# Patient Record
Sex: Male | Born: 1990 | Race: White | Hispanic: No | Marital: Married | Smoking: Former smoker
Health system: Southern US, Community
[De-identification: ages and names within clinical notes are randomized; demographics above are authoritative.]

## PROBLEM LIST (undated history)

## (undated) DIAGNOSIS — Z91018 Allergy to other foods: Secondary | ICD-10-CM

## (undated) DIAGNOSIS — H409 Unspecified glaucoma: Secondary | ICD-10-CM

## (undated) DIAGNOSIS — T7840XA Allergy, unspecified, initial encounter: Secondary | ICD-10-CM

## (undated) DIAGNOSIS — F419 Anxiety disorder, unspecified: Secondary | ICD-10-CM

## (undated) HISTORY — PX: FRACTURE SURGERY: SHX138

## (undated) HISTORY — DX: Anxiety disorder, unspecified: F41.9

## (undated) HISTORY — DX: Unspecified glaucoma: H40.9

## (undated) HISTORY — DX: Allergy, unspecified, initial encounter: T78.40XA

## (undated) HISTORY — PX: HAND SURGERY: SHX662

---
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
## 2009-04-07 ENCOUNTER — Emergency Department: Payer: Self-pay | Admitting: Emergency Medicine

## 2010-03-28 ENCOUNTER — Encounter (INDEPENDENT_AMBULATORY_CARE_PROVIDER_SITE_OTHER): Payer: Self-pay | Admitting: Internal Medicine

## 2010-03-28 ENCOUNTER — Ambulatory Visit: Payer: Self-pay | Admitting: *Deleted

## 2010-05-16 ENCOUNTER — Observation Stay: Payer: Self-pay | Admitting: Surgery

## 2010-06-27 NOTE — Letter (Signed)
Summary: DOT-Dept of Transportation  DOT-Dept of Transportation   Imported By: Dorna Leitz 03/28/2010 14:36:39  _____________________________________________________________________  External Attachment:    Type:   Image     Comment:   External Document
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
## 2011-04-24 ENCOUNTER — Emergency Department: Payer: Self-pay | Admitting: Unknown Physician Specialty

## 2014-06-10 ENCOUNTER — Emergency Department: Payer: Self-pay | Admitting: Emergency Medicine

## 2014-06-10 LAB — COMPREHENSIVE METABOLIC PANEL
ALBUMIN: 4 g/dL (ref 3.4–5.0)
ALK PHOS: 91 U/L
ANION GAP: 11 (ref 7–16)
BUN: 15 mg/dL (ref 7–18)
Bilirubin,Total: 0.5 mg/dL (ref 0.2–1.0)
CALCIUM: 8.3 mg/dL — AB (ref 8.5–10.1)
Chloride: 102 mmol/L (ref 98–107)
Co2: 27 mmol/L (ref 21–32)
Creatinine: 1.28 mg/dL (ref 0.60–1.30)
EGFR (African American): >60
EGFR (Non-African Amer.): >60
Glucose: 153 mg/dL — ABNORMAL HIGH (ref 65–99)
Osmolality: 283 (ref 275–301)
Potassium: 2.7 mmol/L — ABNORMAL LOW (ref 3.5–5.1)
SGOT(AST): 28 U/L (ref 15–37)
SGPT (ALT): 54 U/L
Sodium: 140 mmol/L (ref 136–145)
Total Protein: 7 g/dL (ref 6.4–8.2)

## 2014-06-10 LAB — CBC WITH DIFFERENTIAL/PLATELET
BASOS PCT: 0.4 %
Basophil #: 0.1 10*3/uL (ref 0.0–0.1)
EOS PCT: 1.4 %
Eosinophil #: 0.3 10*3/uL (ref 0.0–0.7)
HCT: 51.1 % (ref 40.0–52.0)
HGB: 16.9 g/dL (ref 13.0–18.0)
LYMPHS PCT: 31.3 %
Lymphocyte #: 6 10*3/uL — ABNORMAL HIGH (ref 1.0–3.6)
MCH: 29.1 pg (ref 26.0–34.0)
MCHC: 33.1 g/dL (ref 32.0–36.0)
MCV: 88 fL (ref 80–100)
MONOS PCT: 9.7 %
Monocyte #: 1.9 x10 3/mm — ABNORMAL HIGH (ref 0.2–1.0)
NEUTROS PCT: 57.2 %
Neutrophil #: 11 10*3/uL — ABNORMAL HIGH (ref 1.4–6.5)
Platelet: 334 10*3/uL (ref 150–440)
RBC: 5.83 10*6/uL (ref 4.40–5.90)
RDW: 12.8 % (ref 11.5–14.5)
WBC: 19.2 10*3/uL — ABNORMAL HIGH (ref 3.8–10.6)

## 2014-06-10 LAB — TROPONIN I: Troponin-I: <0.02 ng/mL

## 2014-06-10 LAB — CK TOTAL AND CKMB (NOT AT ARMC)
CK, Total: 363 U/L — ABNORMAL HIGH (ref 39–308)
CK-MB: 2.5 ng/mL (ref 0.5–3.6)

## 2014-09-01 ENCOUNTER — Ambulatory Visit
Admit: 2014-09-01 | Disposition: A | Discharge status: Home / Self Care | Payer: Self-pay | Attending: Unknown Physician Specialty | Admitting: Unknown Physician Specialty

## 2014-10-26 ENCOUNTER — Other Ambulatory Visit: Payer: Self-pay | Admitting: Family Medicine

## 2014-10-26 ENCOUNTER — Ambulatory Visit
Admission: RE | Admit: 2014-10-26 | Discharge: 2014-10-26 | Disposition: A | Discharge status: Home / Self Care | Payer: BLUE CROSS/BLUE SHIELD | Source: Ambulatory Visit | Attending: Family Medicine | Admitting: Family Medicine

## 2014-10-26 DIAGNOSIS — N433 Hydrocele, unspecified: Secondary | ICD-10-CM | POA: Insufficient documentation

## 2014-10-26 DIAGNOSIS — N448 Other noninflammatory disorders of the testis: Secondary | ICD-10-CM | POA: Insufficient documentation

## 2014-10-26 DIAGNOSIS — N5089 Other specified disorders of the male genital organs: Secondary | ICD-10-CM

## 2014-10-26 DIAGNOSIS — Z880 Allergy status to penicillin: Secondary | ICD-10-CM | POA: Insufficient documentation

## 2016-01-25 ENCOUNTER — Emergency Department
Admission: EM | Admit: 2016-01-25 | Discharge: 2016-01-25 | Disposition: A | Discharge status: Home / Self Care | Payer: Managed Care, Other (non HMO) | Attending: Emergency Medicine | Admitting: Emergency Medicine

## 2016-01-25 DIAGNOSIS — T7840XA Allergy, unspecified, initial encounter: Secondary | ICD-10-CM | POA: Diagnosis not present

## 2016-01-25 DIAGNOSIS — L509 Urticaria, unspecified: Secondary | ICD-10-CM

## 2016-01-25 DIAGNOSIS — X58XXXA Exposure to other specified factors, initial encounter: Secondary | ICD-10-CM | POA: Diagnosis not present

## 2016-01-25 MED ORDER — DIPHENHYDRAMINE HCL 50 MG/ML IJ SOLN
50.0000 mg | Freq: Once | INTRAMUSCULAR | Status: AC
  Administered 2016-01-25: 50 mg via INTRAVENOUS

## 2016-01-25 MED ORDER — EPINEPHRINE 0.3 MG/0.3ML IJ SOAJ: 0.3000 mg | Freq: Once | INTRAMUSCULAR | 0 refills | Status: AC

## 2016-01-25 MED ORDER — SODIUM CHLORIDE 0.9 % IV BOLUS (SEPSIS)
1000.0000 mL | Freq: Once | INTRAVENOUS | Status: AC
  Administered 2016-01-25: 1000 mL via INTRAVENOUS

## 2016-01-25 MED ORDER — FAMOTIDINE IN NACL 20-0.9 MG/50ML-% IV SOLN
20.0000 mg | Freq: Once | INTRAVENOUS | Status: AC
  Administered 2016-01-25: 20 mg via INTRAVENOUS

## 2016-01-25 MED ORDER — METHYLPREDNISOLONE SODIUM SUCC 125 MG IJ SOLR
125.0000 mg | Freq: Once | INTRAMUSCULAR | Status: AC
  Administered 2016-01-25: 125 mg via INTRAVENOUS

## 2016-01-25 MED ORDER — PREDNISONE 20 MG PO TABS: 60.0000 mg | ORAL_TABLET | Freq: Every day | ORAL | 0 refills | Status: AC

## 2016-01-25 NOTE — ED Notes (Signed)
Pt sleeping on stretcher. Pt wife at bedside. Pt woke up and stating he feels better, states nauseous but does not want any zofran at this time. Skin no longer red, no hives present. Pt talking in complete sentences, no difficulty noticed.

## 2016-01-25 NOTE — ED Notes (Addendum)
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 Pt states allergy to penicillin.States 1 year ago had an anaphylactic reaction.   Pt states last eating at 8pm. Symptoms started 30 minutes PTA.

## 2016-01-25 NOTE — ED Provider Notes (Signed)
[facility] Emergency Department Provider Note _________   First MD Initiated Contact with Patient 01/25/16 0109     (approximate)  I have reviewed the triage vital signs and the nursing notes.   HISTORY  Chief Complaint Allergic Reaction    HPI William Morrow is a 25 y.o. male presents with generalized hives with "a little difficulty breathing and swallowing acute onset approximately 30 minutes before presentation. Patient admits to multiple known allergens however unaware of coming in contact with any today. Patient states last time he ate was at 8:00 PM   Past medical history Anaphylaxis There are no active problems to display for this patient.   Past Surgical History:  Procedure Laterality Date  . HAND SURGERY      Prior to Admission medications   Not on File    Allergies Penicillins  History reviewed. No pertinent family history.  Social History Social History  Substance Use Topics  . Smoking status: Never Smoker  . Smokeless tobacco: Not on file  . Alcohol use Yes    Review of Systems Constitutional: No fever/chills Eyes: No visual changes. ENT: No sore throat. Cardiovascular: Denies chest pain. Respiratory: Denies shortness of breath. Gastrointestinal: No abdominal pain.  No nausea, no vomiting.  No diarrhea.  No constipation. Genitourinary: Negative for dysuria. Musculoskeletal: Negative for back pain. Skin: Positive for hives and pruritus Neurological: Negative for headaches, focal weakness or numbness.  10-point ROS otherwise negative.  ____________________________________________   PHYSICAL EXAM:  VITAL SIGNS: ED Triage Vitals  Enc Vitals Group     BP 01/25/16 0111 124/87     Pulse Rate 01/25/16 0111 (!) 101     Resp 01/25/16 0111 19     Temp 01/25/16 0111 97.8 F (36.6 C)     Temp Source 01/25/16 0111 Oral     SpO2 01/25/16 0111 92 %     Weight --      Height --      Head Circumference --      Peak  Flow --      Pain Score 01/25/16 0112 0     Pain Loc --      Pain Edu? --      Excl. in GC? --     Constitutional: Alert and oriented. Well appearing and in no acute distress. Eyes: Conjunctivae are normal. PERRL. EOMI. Head: Atraumatic. Nose: No congestion/rhinnorhea. Mouth/Throat: Mucous membranes are moist.  Oropharynx non-erythematous. Neck: No stridor.  No meningeal signs. Cardiovascular: Normal rate, regular rhythm. Good peripheral circulation. Grossly normal heart sounds. Respiratory: Normal respiratory effort.  No retractions. Lungs CTAB. Gastrointestinal: Soft and nontender. No distention.  Musculoskeletal: No lower extremity tenderness nor edema. No gross deformities of extremities. Neurologic:  Normal speech and language. No gross focal neurologic deficits are appreciated.  Skin: Positive for generalized hives Psychiatric: Mood and affect are normal. Speech and behavior are normal.       Procedures    INITIAL IMPRESSION / ASSESSMENT AND PLAN / ED COURSE  Pertinent labs & imaging results that were available during my care of the patient were reviewed by me and considered in my medical decision making (see chart for details).  Patient given 50 mg IV Benadryl 20 mg Pepcid and 125 mg of Solu-Medrol with resolution of hives. Patient reevaluated 30 minutes after presentation to emergency department no difficulty breathing or swallowing.Patient prescribed prednisone daily 5 days as well as EpiPen's.   Clinical Course    ____________________________________________  FINAL CLINICAL IMPRESSION(S) /  ED DIAGNOSES  Final diagnoses:  Allergic reaction, initial encounter  Hives     MEDICATIONS GIVEN DURING THIS VISIT:  Medications  methylPREDNISolone sodium succinate (SOLU-MEDROL) 125 mg/2 mL injection 125 mg (125 mg Intravenous Given 01/25/16 0107)  diphenhydrAMINE (BENADRYL) injection 50 mg (50 mg Intravenous Given 01/25/16 0106)  famotidine (PEPCID) IVPB 20 mg  premix (0 mg Intravenous Stopped 01/25/16 0220)  sodium chloride 0.9 % bolus 1,000 mL (0 mLs Intravenous Stopped 01/25/16 0416)     NEW OUTPATIENT MEDICATIONS STARTED DURING THIS VISIT:  New Prescriptions   No medications on file      Note:  This document was prepared using Dragon voice recognition software and may include unintentional dictation errors.    Darci Current, MD 01/25/16 [id number]

## 2016-01-25 NOTE — ED Triage Notes (Signed)
Pt presents to ED via [facility]. Pt has red hives all over body, states a little difficulty breathing and swallowing. Pt denies pain. Pt states he ate aroung 8pm, denies being around any food he is allergic to. States allergic to malt wheatStates he is also allergic to penicillin. Pt c/o of nausea.

## 2016-03-02 DIAGNOSIS — N509 Disorder of male genital organs, unspecified: Secondary | ICD-10-CM | POA: Insufficient documentation

## 2016-03-06 DIAGNOSIS — G44219 Episodic tension-type headache, not intractable: Secondary | ICD-10-CM | POA: Insufficient documentation

## 2016-03-06 DIAGNOSIS — Z889 Allergy status to unspecified drugs, medicaments and biological substances status: Secondary | ICD-10-CM | POA: Insufficient documentation

## 2016-04-16 ENCOUNTER — Other Ambulatory Visit: Payer: Self-pay | Admitting: Neurology

## 2016-04-16 DIAGNOSIS — G43119 Migraine with aura, intractable, without status migrainosus: Secondary | ICD-10-CM

## 2016-04-27 ENCOUNTER — Ambulatory Visit: Payer: Managed Care, Other (non HMO)

## 2016-07-26 IMAGING — US US ART/VEN ABD/PELV/SCROTUM DOPPLER LTD
1 series · 14 of 25 positions shown · non-contrast
Comparison: None.

CLINICAL DATA: Palpable lump

EXAM:
SCROTAL ULTRASOUND
DOPPLER ULTRASOUND OF THE TESTICLES
TECHNIQUE: Complete ultrasound examination of the testicles, epididymis, and
other scrotal structures was performed. Color and spectral Doppler
ultrasound were also utilized to evaluate blood flow to the
testicles.

[Series 1: us art/ven abd/pelv/scrotum doppler ltd · 0.07mm/px · 14 of 75 slices shown]
[im 1/75]
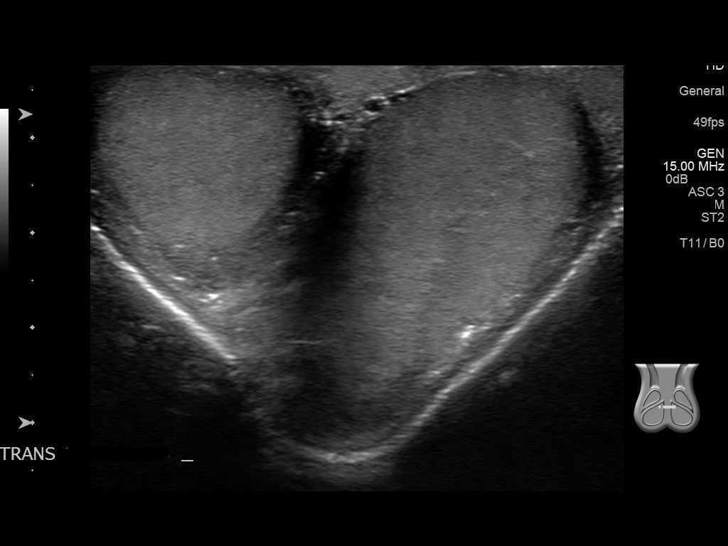
[im 7/75]
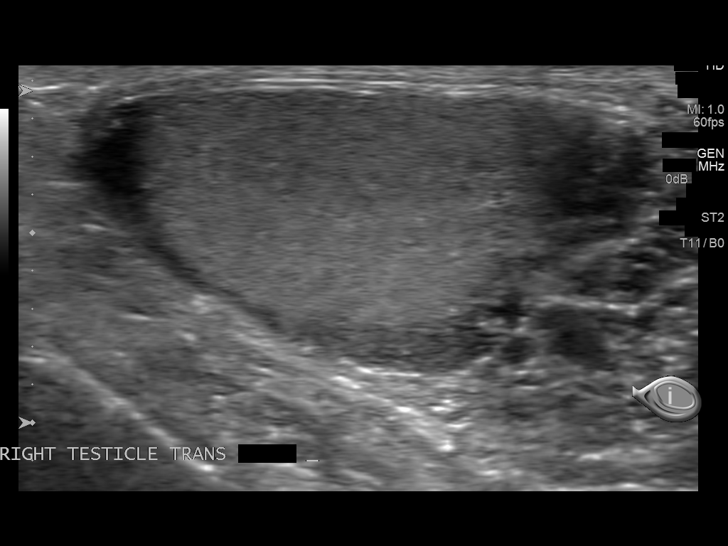
[im 13/75]
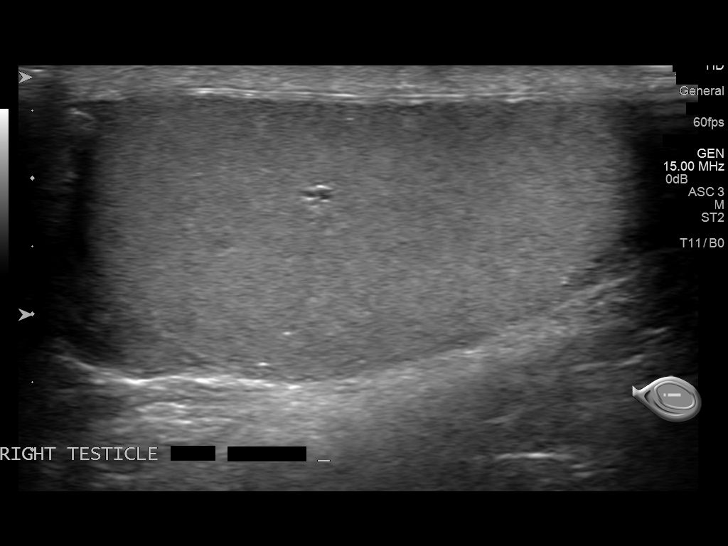
[im 19/75]
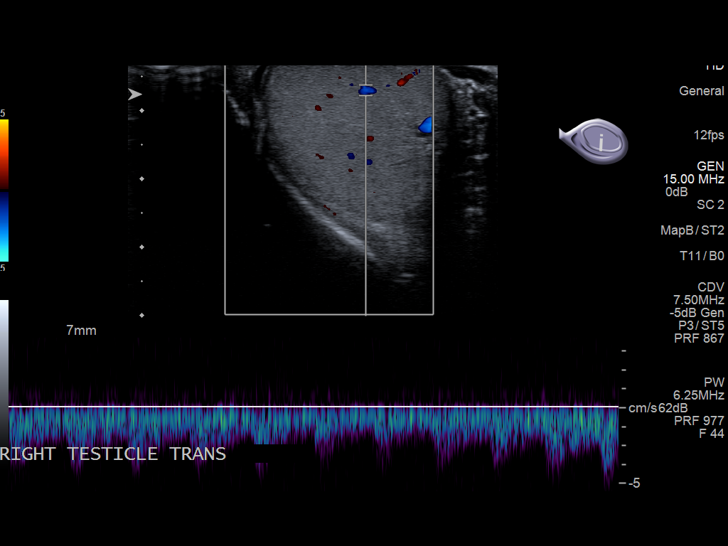
[im 25/75]
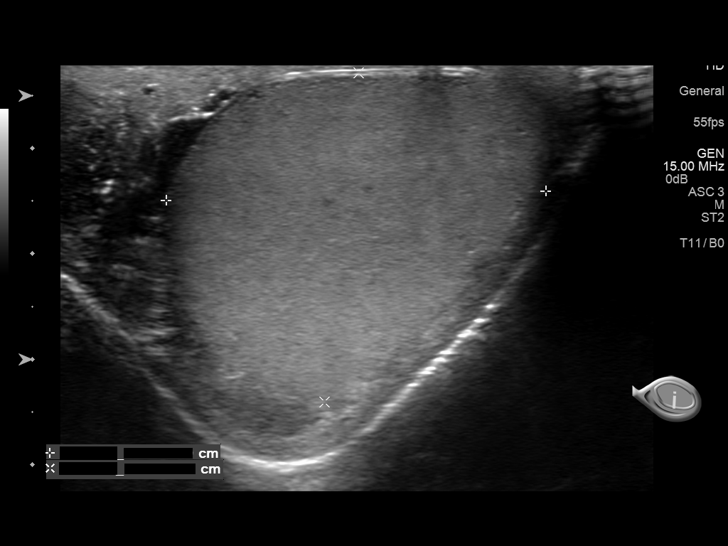
[im 28/75]
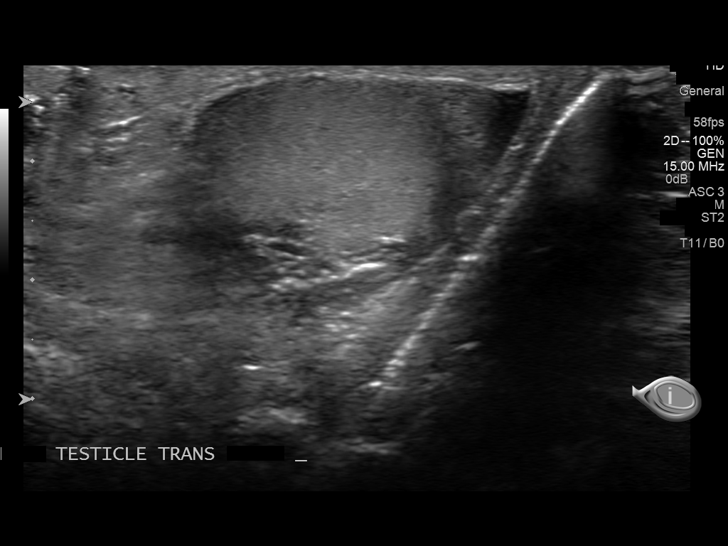
[im 34/75]
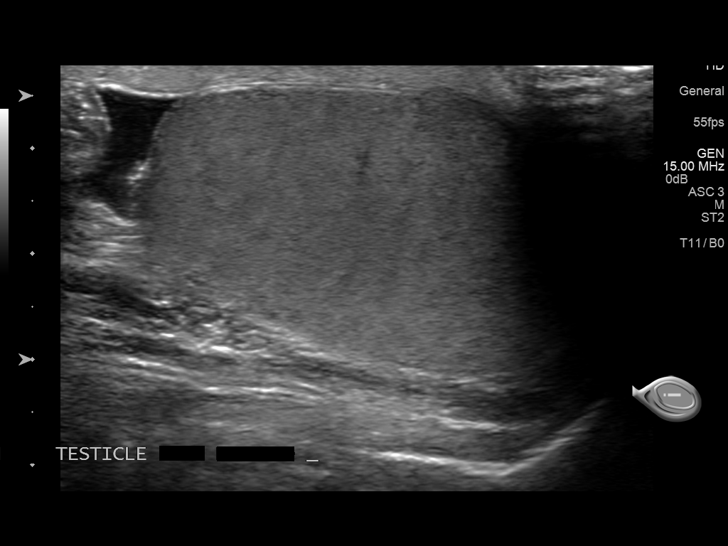
[im 41/75]
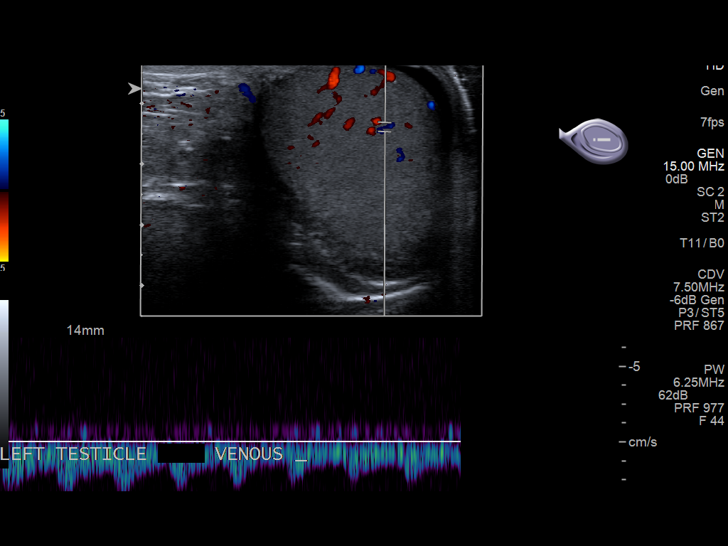
[im 47/75]
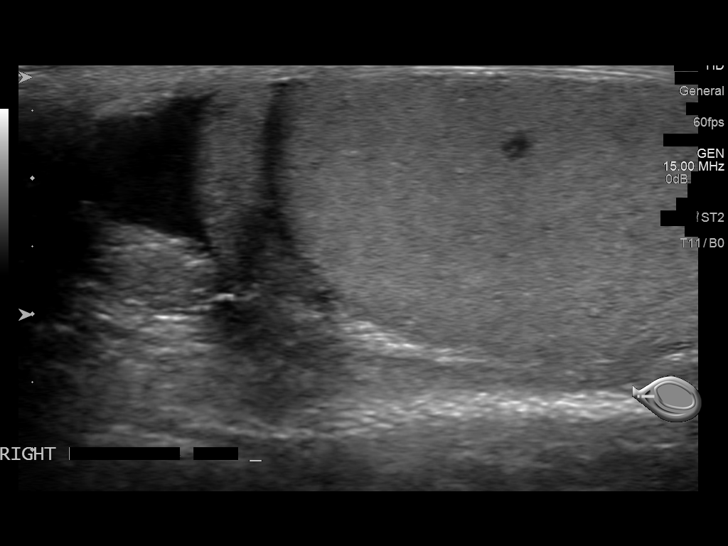
[im 50/75]
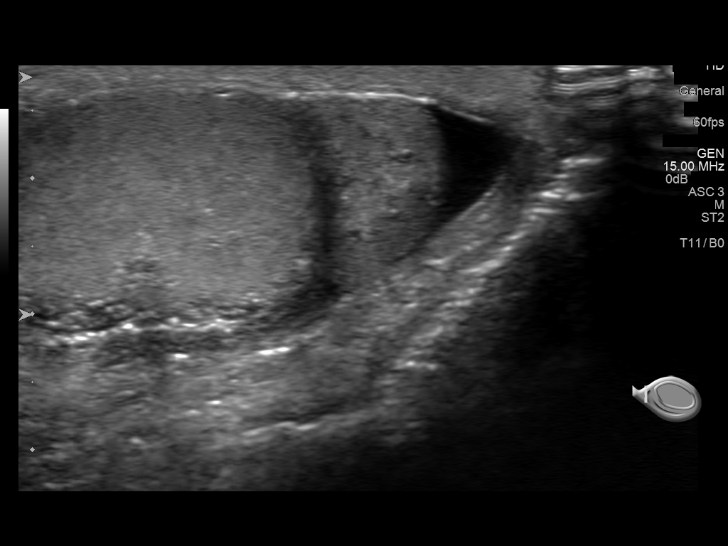
[im 56/75]
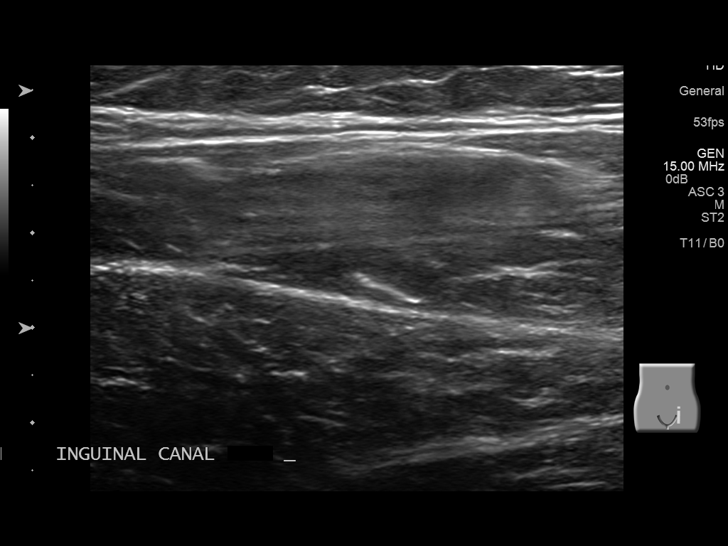
[im 62/75]
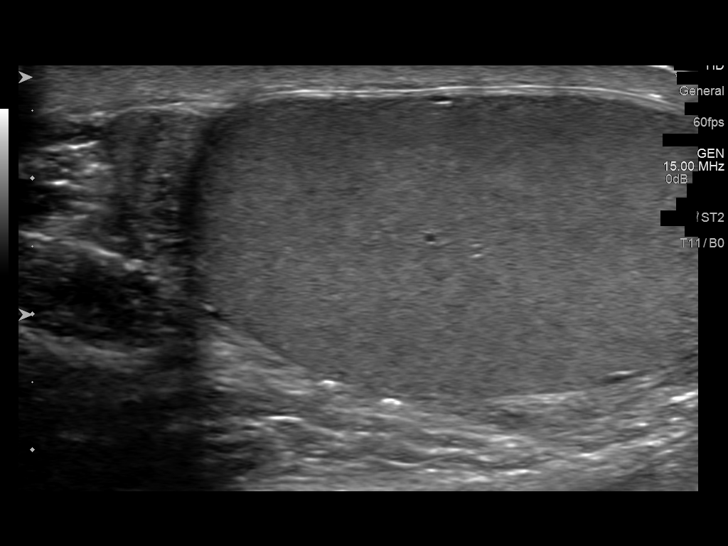
[im 68/75]
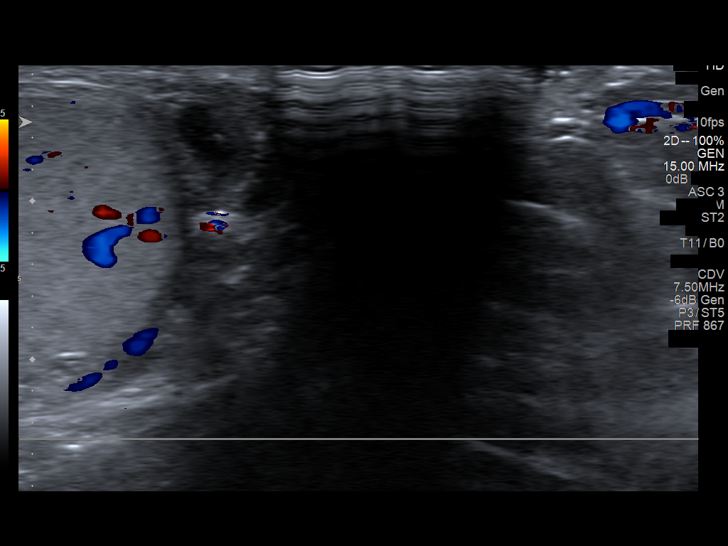
[im 75/75]
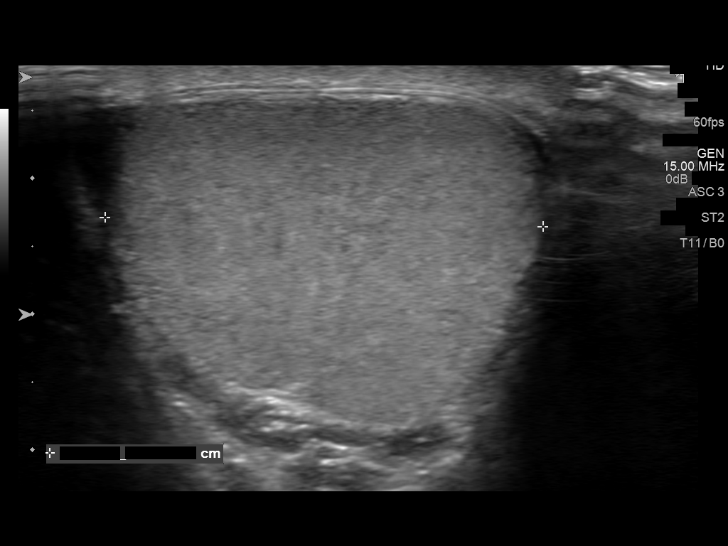

[14 of 25 positions shown; findings below may reference images not displayed]

FINDINGS: Right testicle

Measurements: 4.2 x 2.3 x 3.2 cm.. No mass or microlithiasis
visualized.

Left testicle

Measurements: 4.4 x 2.1 x 3.4 cm.. No mass or microlithiasis
visualized. Small scrotal pearl is noted adjacent to the left
testicle. This measures 3 mm in greatest dimension.

Right epididymis:  Normal in size and appearance.

Left epididymis:  Normal in size and appearance.

Hydrocele:  Small bilateral hydroceles are seen.

Varicocele:  None visualized.

Pulsed Doppler interrogation of both testes demonstrates normal low
resistance arterial and venous waveforms bilaterally.
IMPRESSION: Small left scrotal pleural which corresponds to the palpable
abnormality.

Small bilateral hydroceles.

## 2017-06-27 DIAGNOSIS — Z91018 Allergy to other foods: Secondary | ICD-10-CM | POA: Insufficient documentation

## 2017-06-27 DIAGNOSIS — E559 Vitamin D deficiency, unspecified: Secondary | ICD-10-CM | POA: Insufficient documentation

## 2019-01-20 ENCOUNTER — Other Ambulatory Visit: Payer: Self-pay

## 2019-01-20 DIAGNOSIS — Z20822 Contact with and (suspected) exposure to covid-19: Secondary | ICD-10-CM

## 2019-01-21 LAB — NOVEL CORONAVIRUS, NAA: SARS-CoV-2, NAA: NOT DETECTED

## 2019-01-29 ENCOUNTER — Other Ambulatory Visit: Payer: Self-pay

## 2019-01-29 DIAGNOSIS — Z20822 Contact with and (suspected) exposure to covid-19: Secondary | ICD-10-CM

## 2019-01-30 ENCOUNTER — Telehealth: Payer: Self-pay | Admitting: General Practice

## 2019-01-30 LAB — NOVEL CORONAVIRUS, NAA: SARS-CoV-2, NAA: NOT DETECTED

## 2019-01-30 NOTE — Telephone Encounter (Signed)
Pt called in for covid result.  °Advised of Not Detected result.  °

## 2021-02-04 ENCOUNTER — Ambulatory Visit
Admission: EM | Admit: 2021-02-04 | Discharge: 2021-02-04 | Disposition: A | Discharge status: Home / Self Care | Payer: 59 | Attending: Emergency Medicine | Admitting: Emergency Medicine

## 2021-02-04 DIAGNOSIS — Z20822 Contact with and (suspected) exposure to covid-19: Secondary | ICD-10-CM | POA: Diagnosis not present

## 2021-02-04 DIAGNOSIS — J069 Acute upper respiratory infection, unspecified: Secondary | ICD-10-CM | POA: Diagnosis not present

## 2021-02-04 HISTORY — DX: Allergy to other foods: Z91.018

## 2021-02-04 MED ORDER — BENZONATATE 100 MG PO CAPS: 200.0000 mg | ORAL_CAPSULE | Freq: Three times a day (TID) | ORAL | 0 refills | Status: DC

## 2021-02-04 MED ORDER — IPRATROPIUM BROMIDE 0.06 % NA SOLN: 2.0000 | Freq: Four times a day (QID) | NASAL | 12 refills | Status: DC

## 2021-02-04 MED ORDER — PROMETHAZINE-DM 6.25-15 MG/5ML PO SYRP: 5.0000 mL | ORAL_SOLUTION | Freq: Four times a day (QID) | ORAL | 0 refills | Status: DC | PRN

## 2021-02-04 NOTE — Discharge Instructions (Addendum)
Isolate at home pending the results of your COVID test.  If you test positive then you will have to quarantine for 5 days from the start of your symptoms.  After 5 days you can break quarantine if your symptoms have improved and you have not had a fever for 24 hours without taking Tylenol or ibuprofen.  Use over-the-counter Tylenol and ibuprofen as needed for body aches and fever.  Use the Tessalon Perles during the day as needed for cough and the Promethazine DM cough syrup at nighttime as will make you drowsy.  Use the Atrovent nasal spray every 6 hours as needed, 2 squirts in each nostril, for nasal congestion and postnasal drip.  If you develop any increased shortness of breath-especially at rest, you are unable to speak in full sentences, or is a late sign your lips are turning blue you need to go the ER for evaluation.

## 2021-02-04 NOTE — ED Provider Notes (Signed)
[facility]    CSN: [id number] Arrival date & time: 02/04/21  1941      History   Chief Complaint Chief Complaint  Patient presents with   Cough   Nasal Congestion    HPI William Morrow is a 30 y.o. male.   HPI  30 year old male here for evaluation of respiratory complaints.  Patient reports that for the last 2 days he has been experiencing nasal congestion with clear nasal discharge, watery eyes, sore throat, headache, and productive cough for yellow sputum.  He denies fever, ear pain or pressure, shortness breath or wheezing, GI complaints, or known sick contacts.  Past Medical History:  Diagnosis Date   Allergy to alpha-gal     There are no problems to display for this patient.   Past Surgical History:  Procedure Laterality Date   HAND SURGERY         Home Medications    Prior to Admission medications   Medication Sig Start Date End Date Taking? Authorizing Provider  benzonatate (TESSALON) 100 MG capsule Take 2 capsules (200 mg total) by mouth every 8 (eight) hours. 02/04/21  Yes Becky Augusta, NP  ipratropium (ATROVENT) 0.06 % nasal spray Place 2 sprays into both nostrils 4 (four) times daily. 02/04/21  Yes Becky Augusta, NP  promethazine-dextromethorphan (PROMETHAZINE-DM) 6.25-15 MG/5ML syrup Take 5 mLs by mouth 4 (four) times daily as needed. 02/04/21  Yes Becky Augusta, NP    Family History History reviewed. No pertinent family history.  Social History Social History   Tobacco Use   Smoking status: Every Day    Packs/day: 0.50    Types: Cigarettes   Smokeless tobacco: Never  Vaping Use   Vaping Use: Never used  Substance Use Topics   Alcohol use: Yes    Comment: socially   Drug use: No     Allergies   Alpha-gal and Penicillins   Review of Systems Review of Systems  Constitutional:  Negative for activity change, appetite change and fever.  HENT:  Positive for congestion, postnasal drip, rhinorrhea and sore throat. Negative for  ear pain.   Respiratory:  Positive for cough. Negative for shortness of breath and stridor.   Gastrointestinal:  Negative for diarrhea, nausea and vomiting.  Neurological:  Positive for headaches.  Hematological: Negative.     Physical Exam Triage Vital Signs ED Triage Vitals  Enc Vitals Group     BP 02/04/21 1030 137/89     Pulse Rate 02/04/21 1030 84     Resp 02/04/21 1030 18     Temp 02/04/21 1030 98.6 F (37 C)     Temp Source 02/04/21 1030 Oral     SpO2 02/04/21 1030 96 %     Weight 02/04/21 1032 270 lb (122.5 kg)     Height 02/04/21 1032 6\' 1"  (1.854 m)     Head Circumference --      Peak Flow --      Pain Score 02/04/21 1032 0     Pain Loc --      Pain Edu? --      Excl. in GC? --    No data found.  Updated Vital Signs BP 137/89 (BP Location: Left Arm)   Pulse 84   Temp 98.6 F (37 C) (Oral)   Resp 18   Ht 6\' 1"  (1.854 m)   Wt 270 lb (122.5 kg)   SpO2 96%   BMI 35.62 kg/m   Visual Acuity Right Eye Distance:   Left  Eye Distance:   Bilateral Distance:    Right Eye Near:   Left Eye Near:    Bilateral Near:     Physical Exam Vitals and nursing note reviewed.  Constitutional:      General: He is not in acute distress.    Appearance: Normal appearance. He is not ill-appearing.  HENT:     Head: Normocephalic and atraumatic.     Right Ear: Tympanic membrane, ear canal and external ear normal. There is no impacted cerumen.     Left Ear: Tympanic membrane, ear canal and external ear normal. There is no impacted cerumen.     Nose: Congestion and rhinorrhea present.     Mouth/Throat:     Mouth: Mucous membranes are moist.     Pharynx: Oropharynx is clear. Posterior oropharyngeal erythema present.  Cardiovascular:     Rate and Rhythm: Normal rate and regular rhythm.     Pulses: Normal pulses.     Heart sounds: Normal heart sounds. No murmur heard.   No gallop.  Pulmonary:     Effort: Pulmonary effort is normal.     Breath sounds: Normal breath sounds.  No wheezing, rhonchi or rales.  Musculoskeletal:     Cervical back: Normal range of motion and neck supple.  Lymphadenopathy:     Cervical: No cervical adenopathy.  Skin:    General: Skin is warm and dry.     Capillary Refill: Capillary refill takes less than 2 seconds.     Findings: No erythema or rash.  Neurological:     General: No focal deficit present.     Mental Status: He is alert and oriented to person, place, and time.  Psychiatric:        Mood and Affect: Mood normal.        Behavior: Behavior normal.        Thought Content: Thought content normal.        Judgment: Judgment normal.     UC Treatments / Results  Labs (all labs ordered are listed, but only abnormal results are displayed) Labs Reviewed  SARS CORONAVIRUS 2 (TAT 6-24 HRS)    EKG   Radiology No results found.  Procedures Procedures (including critical care time)  Medications Ordered in UC Medications - No data to display  Initial Impression / Assessment and Plan / UC Course  I have reviewed the triage vital signs and the nursing notes.  Pertinent labs & imaging results that were available during my care of the patient were reviewed by me and considered in my medical decision making (see chart for details).  Patient is very pleasant, nontoxic-appearing 30 year old male here for evaluation of respiratory complaints as outlined in HPI above.  Patient's physical exam reveals pearly gray tympanic membranes bilaterally with a normal light reflex and clear external auditory canals.  Nasal mucosa is erythematous edematous with scant clear nasal discharge.  Oropharyngeal exam reveals posterior oropharyngeal erythema and cobblestoning with clear postnasal drip.  No cervical lymphadenopathy appreciated exam.  Cardiopulmonary exam reveals clear lung sounds in all fields.  Patient exam is consistent with a viral URI with cough.  Will swab patient for COVID.  Due to the lack of fever I do not suspect patient has  influenza.  Patient will be discharged home to isolate pending the results of the COVID test and the left quarantine for 5 days from onset of symptoms if he is COVID-positive.  We will give Atrovent nasal spray to help with nasal congestion and runny nose.  We will also prescribe Tessalon Perles and Promethazine DM cough syrup to help with cough and congestion.  ER and return precautions reviewed with patient.  Patient denies need for work note.   Final Clinical Impressions(s) / UC Diagnoses   Final diagnoses:  Viral URI with cough     Discharge Instructions      Isolate at home pending the results of your COVID test.  If you test positive then you will have to quarantine for 5 days from the start of your symptoms.  After 5 days you can break quarantine if your symptoms have improved and you have not had a fever for 24 hours without taking Tylenol or ibuprofen.  Use over-the-counter Tylenol and ibuprofen as needed for body aches and fever.  Use the Tessalon Perles during the day as needed for cough and the Promethazine DM cough syrup at nighttime as will make you drowsy.  Use the Atrovent nasal spray every 6 hours as needed, 2 squirts in each nostril, for nasal congestion and postnasal drip.  If you develop any increased shortness of breath-especially at rest, you are unable to speak in full sentences, or is a late sign your lips are turning blue you need to go the ER for evaluation.      ED Prescriptions     Medication Sig Dispense Auth. Provider   benzonatate (TESSALON) 100 MG capsule Take 2 capsules (200 mg total) by mouth every 8 (eight) hours. 21 capsule Becky Augusta, NP   ipratropium (ATROVENT) 0.06 % nasal spray Place 2 sprays into both nostrils 4 (four) times daily. 15 mL Becky Augusta, NP   promethazine-dextromethorphan (PROMETHAZINE-DM) 6.25-15 MG/5ML syrup Take 5 mLs by mouth 4 (four) times daily as needed. 118 mL Becky Augusta, NP      PDMP not reviewed this encounter.    Becky Augusta, NP 02/04/21 1143

## 2021-02-04 NOTE — ED Triage Notes (Signed)
Pt reports watery eyes, cough with "yellow mucus", nasal congestion for 2 days. Denies fever.  Negative home COVID test (yesterday)

## 2021-02-05 LAB — SARS CORONAVIRUS 2 (TAT 6-24 HRS): SARS Coronavirus 2: NEGATIVE

## 2022-09-06 ENCOUNTER — Ambulatory Visit: Payer: 59 | Admitting: Internal Medicine

## 2022-09-17 ENCOUNTER — Ambulatory Visit (INDEPENDENT_AMBULATORY_CARE_PROVIDER_SITE_OTHER): Payer: 59 | Admitting: Internal Medicine

## 2022-09-17 ENCOUNTER — Encounter: Payer: Self-pay | Admitting: Internal Medicine

## 2022-09-17 DIAGNOSIS — G4700 Insomnia, unspecified: Secondary | ICD-10-CM | POA: Insufficient documentation

## 2022-09-17 DIAGNOSIS — I10 Essential (primary) hypertension: Secondary | ICD-10-CM | POA: Diagnosis not present

## 2022-09-17 DIAGNOSIS — R198 Other specified symptoms and signs involving the digestive system and abdomen: Secondary | ICD-10-CM | POA: Insufficient documentation

## 2022-09-17 DIAGNOSIS — J302 Other seasonal allergic rhinitis: Secondary | ICD-10-CM | POA: Diagnosis not present

## 2022-09-17 DIAGNOSIS — G43711 Chronic migraine without aura, intractable, with status migrainosus: Secondary | ICD-10-CM

## 2022-09-17 DIAGNOSIS — Z119 Encounter for screening for infectious and parasitic diseases, unspecified: Secondary | ICD-10-CM | POA: Diagnosis not present

## 2022-09-17 DIAGNOSIS — Z91018 Allergy to other foods: Secondary | ICD-10-CM | POA: Diagnosis not present

## 2022-09-17 DIAGNOSIS — Z889 Allergy status to unspecified drugs, medicaments and biological substances status: Secondary | ICD-10-CM

## 2022-09-17 DIAGNOSIS — G4709 Other insomnia: Secondary | ICD-10-CM

## 2022-09-17 DIAGNOSIS — G43909 Migraine, unspecified, not intractable, without status migrainosus: Secondary | ICD-10-CM | POA: Insufficient documentation

## 2022-09-17 MED ORDER — SUMATRIPTAN SUCCINATE 50 MG PO TABS: 50.0000 mg | ORAL_TABLET | ORAL | 11 refills | Status: DC | PRN

## 2022-09-17 MED ORDER — METFORMIN HCL 500 MG PO TABS: 500.0000 mg | ORAL_TABLET | Freq: Two times a day (BID) | ORAL | 3 refills | Status: DC

## 2022-09-17 MED ORDER — TOPIRAMATE 25 MG PO TABS: 25.0000 mg | ORAL_TABLET | Freq: Two times a day (BID) | ORAL | 3 refills | Status: DC

## 2022-09-17 MED ORDER — LOSARTAN POTASSIUM 25 MG PO TABS: 25.0000 mg | ORAL_TABLET | Freq: Every day | ORAL | 3 refills | Status: DC

## 2022-09-17 MED ORDER — ALBUTEROL SULFATE HFA 108 (90 BASE) MCG/ACT IN AERS: 1.0000 | INHALATION_SPRAY | RESPIRATORY_TRACT | 11 refills | Status: DC | PRN

## 2022-09-17 MED ORDER — EPINEPHRINE 0.3 MG/0.3ML IJ SOAJ: 0.3000 mg | INTRAMUSCULAR | 1 refills | Status: DC | PRN

## 2022-09-17 MED ORDER — FLUTICASONE PROPIONATE 50 MCG/ACT NA SUSP: 1.0000 | Freq: Every day | NASAL | 0 refills | Status: DC

## 2022-09-17 NOTE — Assessment & Plan Note (Signed)
Start losartan Sleep apnea screening positive Noted that sleep apnea causes hypertension and migraines Encouraged weight loss

## 2022-09-17 NOTE — Progress Notes (Signed)
[facility]  Phone: [phone]  New patient visit  Visit Date: 09/17/2022 Patient: William Morrow   DOB: 11/01/1990   31 y.o. Male  MRN: [id number] PCP:  William Olszewski, MD  (establishing care today)  Today's Health Care Provider: Lula Olszewski, MD   Assessment and Plan:   This initial visit focused on establishing a primary care relationship and gathering a comprehensive medical history. We addressed key concerns and prioritized next steps. For any remaining concerns, we encourage William Morrow to schedule a follow-up appointment at his earliest convenience.   To further complete the medical history and optimize health maintenance, we also recommend scheduling an Annual Comprehensive Physical Exam (CPE) also known as Preventive Medicine Visit.  Morbid obesity Assessment & Plan: We will incorporate routine monitoring of body metrics as a proactive approach to health maintenance at future visits. Encouraged the continuation of current health-positive behaviors and am available for additional supportive measures as desired. I support waist circumference monitoring rather than focusing on weight, to avoid unhealthy weight loss (muscle loss). I chose topiramate to help with this and his migraines  Orders: -     CBC; Future -     Comprehensive metabolic panel; Future -     Lipid panel; Future -     Hemoglobin A1c; Future -     TSH; Future -     metFORMIN HCl; Take 1 tablet (500 mg total) by mouth 2 (two) times daily with a meal.  Dispense: 180 tablet; Refill: 3  Screening examination for infectious disease -     HIV Antibody (routine testing w rflx); Future -     Hepatitis C antibody; Future  Hypertension, unspecified type Assessment & Plan: Start losartan Sleep apnea screening positive Noted that sleep apnea causes hypertension and migraines Encouraged weight loss   Orders: -     Losartan Potassium; Take 1 tablet (25 mg total) by mouth daily.  Dispense:  90 tablet; Refill: 3  Seasonal allergies -     Albuterol Sulfate HFA; Inhale 1 puff into the lungs every 4 (four) hours as needed for wheezing or shortness of breath.  Dispense: 2 each; Refill: 11 -     EPINEPHrine; Inject 0.3 mg into the muscle as needed for anaphylaxis.  Dispense: 1 each; Refill: 1 -     Fluticasone Propionate; Place 1 spray into both nostrils daily.  Dispense: 10 mL; Refill: 0  History of allergic reaction -     Albuterol Sulfate HFA; Inhale 1 puff into the lungs every 4 (four) hours as needed for wheezing or shortness of breath.  Dispense: 2 each; Refill: 11 -     EPINEPHrine; Inject 0.3 mg into the muscle as needed for anaphylaxis.  Dispense: 1 each; Refill: 1 -     Fluticasone Propionate; Place 1 spray into both nostrils daily.  Dispense: 10 mL; Refill: 0  Allergy to alpha-gal -     Albuterol Sulfate HFA; Inhale 1 puff into the lungs every 4 (four) hours as needed for wheezing or shortness of breath.  Dispense: 2 each; Refill: 11 -     EPINEPHrine; Inject 0.3 mg into the muscle as needed for anaphylaxis.  Dispense: 1 each; Refill: 1 -     Fluticasone Propionate; Place 1 spray into both nostrils daily.  Dispense: 10 mL; Refill: 0  Abnormal bowel movement Assessment & Plan: This was major complaint Sounds like IBS-D but also may be due to alpha gal allergy Probably needs colonoscopy to  rule out inflammatory bowel disease   Orders: -     Ambulatory referral to Gastroenterology  Intractable chronic migraine without aura and with status migrainosus Assessment & Plan: I explained how migraines that are constant like his are treated with 2 medication types preventive and abortive.  I explained the different types of preventative and abortive medications as follows preventative medications include propranolol topiramate and amitriptyline and these are taken daily if different side effects depending on the medicine but I think that topiramate would be the best choice for him  because it has a weight loss benefit but at the higher dose the risks are it is going to make him a little lower processing speed at the high doses but we are just going to start with a low-dose.    The abortive medication issues include Imitrex mainly to start just based on the way insurance companies force it as the first medicine is not necessarily the best but we will have to try it first a lot of people find it to work just great and but basically it is chosen because it is the cheapest the most common side effect is spasms like he can affect chest spasms or even heart spasms it feels weird     Orders: -     Topiramate; Take 1 tablet (25 mg total) by mouth 2 (two) times daily. To start: just 1 tablet daily for a week. Then 1 tablet twice daily long term and important to take consistent - helps with weight loss and preventing migraines  Dispense: 180 tablet; Refill: 3 -     SUMAtriptan Succinate; Take 1 tablet (50 mg total) by mouth every 2 (two) hours as needed for migraine. May repeat in 2 hours if headache persists or recurs.  Dispense: 10 tablet; Refill: 11  Other insomnia -     Ambulatory referral to Sleep Studies      Subjective:   William Morrow presents today with intent to establish care with William Olszewski, MD as his Primary Care Provider (PCP) going forward.   His main concern(s) / chief complaint(s) are New Patient (Initial Visit), Frequent/inconsistent bowel movement (Having four to five bowel movements per day, some are runny and some are solid.), Medication Refill (Epipen and prednisone.), Urticaria (Most recent reaction was Thursday. Not sure about the cause./), and Hypertension (High readings in past)   HPI  Problem-oriented charting was used to develop and update his medical history: Problem  Hypertension   Home readings: high often Patient reports taking current medications consistently and not experiencing any significant associated side effects or symptoms  As of  09/17/2022 Not on meds Lab Results  Component Value Date   NA 140 06/10/2014   K 2.7 (L) 06/10/2014    Morbid Obesity  Abnormal Bowel Movement   He has many years of abnormal bowels going 2-3 times a day but it has been worse in the last year and a half more like 6 times a day or more never had a colonoscopy does have an alpha gal allergy and eats red meat   Seasonal Allergies  Migraine   Affected by dehydration with landscaping job Keeping it cool seems to help    Insomnia   Wakes up several times at night and sometimes difficulty getting to sleep STOP-Bang Score Do you snore loudly?: Yes Do you often feel tired, fatigued, or sleepy during the daytime?: Yes Has anyone observed you stop breathing during sleep?: No Do you have (or are you being  treated for) high blood pressure?: Yes Recent BMI (Calculated):  BMI Readings from Last 1 Encounters:  09/17/22 36.57 kg/m   Is BMI greater than 35 kg/m2?:  Yes Age older than 33 years old?: No Has large neck size > 40 cm (15.7 in, large male shirt size, large male collar size > 16): Yes Gender Male?:  Yes STOP-Bang Total Score(total yes answers):  6    Allergy to Alpha-Gal   Eats red meat anyway but gets intermittent anaphylaxis and hives    History of Allergic Reaction   Prior history of anaphylactic type reactions with airway involvement with prior emergency room visits.  Allergy testing performed through [facility].       Depression Screen    09/17/2022   10:44 AM  PHQ 2/9 Scores  PHQ - 2 Score 1  PHQ- 9 Score 7   No results found for any visits on 09/17/22.  The following were reviewed and entered/updated into his MEDICAL RECORD NUMBERPast Medical History:  Diagnosis Date   Allergy    Allergy to alpha-gal    Anxiety    Glaucoma    Past Surgical History:  Procedure Laterality Date   FRACTURE SURGERY     HAND SURGERY     Family History  Problem Relation Age of Onset   Cancer Mother    Cancer Maternal  Grandmother    Diabetes Paternal Grandmother    Outpatient Medications Prior to Visit  Medication Sig Dispense Refill   Fexofenadine HCl (ALLEGRA PO) Take by mouth daily.     albuterol (VENTOLIN HFA) 108 (90 Base) MCG/ACT inhaler Inhale into the lungs.     EPINEPHrine 0.3 mg/0.3 mL IJ SOAJ injection Inject into the muscle.     fluticasone (FLONASE) 50 MCG/ACT nasal spray Place into the nose.     benzonatate (TESSALON) 100 MG capsule Take 2 capsules (200 mg total) by mouth every 8 (eight) hours. 21 capsule 0   ipratropium (ATROVENT) 0.06 % nasal spray Place 2 sprays into both nostrils 4 (four) times daily. 15 mL 12   promethazine-dextromethorphan (PROMETHAZINE-DM) 6.25-15 MG/5ML syrup Take 5 mLs by mouth 4 (four) times daily as needed. 118 mL 0   No facility-administered medications prior to visit.    Allergies  Allergen Reactions   Alpha-Gal Anaphylaxis and Hives   Penicillins     REACTION: HIVES   Social History   Tobacco Use   Smoking status: Former    Packs/day: .5    Types: Cigarettes   Smokeless tobacco: Never  Vaping Use   Vaping Use: Never used  Substance Use Topics   Alcohol use: Yes    Comment: socially   Drug use: No    Immunization History  Administered Date(s) Administered   DTP 10/28/1990, 07/23/1991   Dtap, Unspecified 11/03/2015   Hep B, Unspecified 03/20/2002, 04/21/2002, 10/02/2002   Influenza,inj,Quad PF,6+ Mos 03/10/2015   MMR 07/23/1991   OPV 07/23/1991   Td (Adult),5 Lf Tetanus Toxid, Preservative Free 06/27/2017    Objective:  Physical Exam  BP (!) 130/98 (BP Location: Left Arm, Patient Position: Sitting)   Pulse 96   Temp 98.6 F (37 C) (Temporal)   Ht  (1.854 m)   Wt 277 lb 3.2 oz (125.7 kg)   SpO2 95%   BMI 36.57 kg/m  Wt Readings from Last 10 Encounters:  09/17/22 277 lb 3.2 oz (125.7 kg)  02/04/21 270 lb (122.5 kg)   Vital signs reviewed.  Nursing notes reviewed. Weight trend reviewed. Abnormalities  and problem-specific  physical exam findings:  truncal adiposity no sneezing or urticaria at present General Appearance:  Well developed, well nourished, well-groomed, healthy-appearing male with Body mass index is 36.57 kg/m. No acute distress appreciable.   Skin: Clear and well-hydrated. Pulmonary:  Normal work of breathing at rest, no respiratory distress apparent. SpO2: 95 %  Musculoskeletal: He demonstrates smooth and coordinated movements throughout all major joints. All extremities are intact.  Neurological:  Awake, alert, oriented, and engaged.  No obvious focal neurological deficits or cognitive impairments.  Sensorium seems unclouded. Gait is smooth and coordinated Psychiatric:  Appropriate mood, pleasant and cooperative demeanor, cheerful and engaged during the exam  Results Reviewed (reviewed labs/imaging may be also be found in the assessment / plan section):

## 2022-09-17 NOTE — Assessment & Plan Note (Signed)
We will incorporate routine monitoring of body metrics as a proactive approach to health maintenance at future visits. Encouraged the continuation of current health-positive behaviors and am available for additional supportive measures as desired. I support waist circumference monitoring rather than focusing on weight, to avoid unhealthy weight loss (muscle loss). I chose topiramate to help with this and his migraines

## 2022-09-17 NOTE — Assessment & Plan Note (Signed)
I explained how migraines that are constant like his are treated with 2 medication types preventive and abortive.  I explained the different types of preventative and abortive medications as follows preventative medications include propranolol topiramate and amitriptyline and these are taken daily if different side effects depending on the medicine but I think that topiramate would be the best choice for him because it has a weight loss benefit but at the higher dose the risks are it is going to make him a little lower processing speed at the high doses but we are just going to start with a low-dose.    The abortive medication issues include Imitrex mainly to start just based on the way insurance companies force it as the first medicine is not necessarily the best but we will have to try it first a lot of people find it to work just great and but basically it is chosen because it is the cheapest the most common side effect is spasms like he can affect chest spasms or even heart spasms it feels weird

## 2022-09-17 NOTE — Patient Instructions (Addendum)
Welcome aboard!   Today's visit was a valuable first step in understanding your health and starting your personalized care journey. We discussed your medical history and medications in detail. Given the extensive information, we prioritized addressing your most pressing concerns.  We understood those concerns to be:  New Patient (Initial Visit), Frequent/inconsistent bowel movement (Having four to five bowel movements per day, some are runny and some are solid.), Medication Refill (Epipen and prednisone.), Urticaria (Most recent reaction was Thursday. Not sure about the cause./), and Hypertension (High readings in past)   Building a Complete Picture  To create the most effective care plan possible, we may need additional information from previous providers. We encouraged you to gather any relevant medical records for your next visit. This will help [place] build a more complete picture and develop a personalized plan together. In the meantime, we'll address your immediate concerns and provide resources to help you manage all of your medical issues.  We encourage you to use MyChart to review these efforts, and to help [place] find and correct any omissions or errors in your medical chart.  Managing Your Health Over Time  Managing every aspect of your health in a single visit isn't always feasible, but that's okay.  We addressed your most pressing concerns today and charted a course for future care. Acute conditions or preventive care measures may require further attention.  We encourage you to schedule a follow-up visit at your earliest convenience to discuss any unresolved issues.  We strongly encourage participation in annual preventive care visits to help [place] develop a more thorough understanding of your health and to help you maintain optimal wellness - please inquire about scheduling your next one with [place] at your earliest convenience.  Your Satisfaction Matters  It was a pleasure seeing you today!  Your  health and satisfaction will always be my top priorities. If you believe your experience today was worthy of a 5-star rating, I'd be grateful for your feedback!  Lula Olszewski, MD   Next Steps  Schedule Follow-Up:  We recommend a follow-up appointment in No follow-ups on file. If your condition worsens before then, please call us or seek emergency care. Preventive Care:  Don't forget to schedule your annual preventive care visit!  This important checkup is typically covered by insurance and helps identify potential health issues early.  Typically its 100% insurance covered with no co-pay and helps to get surveillance labwork paid for through your insurance provider.  Sometimes it even lowers your insurance premiums to participate. Medical Information Release:  For any relevant medical information we don't have, please sign a release form so we can obtain it for your records. Lab & X-ray Appointments:  Scheduled any incomplete lab tests today or call us to schedule.  X-Rays can be done without an appointment at [facility] at [facility] ([address]), M-F 8:30am-noon or 1pm-5pm.  Just tell them you're there for X-rays ordered by Dr. Jon Billings.  We'll receive the results and contact you by phone or MyChart to discuss next steps.  Bring to Your Next Appointment  Medications: Please bring all your medication bottles to your next appointment to ensure we have an accurate record of your prescriptions. Health Diaries: If you're monitoring any health conditions at home, keeping a diary of your readings can be very helpful for discussions at your next appointment.  Reviewing Your Records  Please Review this early draft of your clinical notes below and the final encounter  summary tomorrow on MyChart after its been completed.   Morbid obesity Assessment & Plan: We will incorporate routine monitoring of body metrics as a proactive approach to health maintenance at future  visits. Encouraged the continuation of current health-positive behaviors and am available for additional supportive measures as desired. I support waist circumference monitoring rather than focusing on weight, to avoid unhealthy weight loss (muscle loss). I chose topiramate to help with this and his migraines  Orders: -     CBC; Future -     Comprehensive metabolic panel; Future -     Lipid panel; Future -     Hemoglobin A1c; Future -     TSH; Future -     metFORMIN HCl; Take 1 tablet (500 mg total) by mouth 2 (two) times daily with a meal.  Dispense: 180 tablet; Refill: 3  Screening examination for infectious disease -     HIV Antibody (routine testing w rflx); Future -     Hepatitis C antibody; Future  Hypertension, unspecified type Assessment & Plan: Start losartan Sleep apnea screening positive Noted that sleep apnea causes hypertension and migraines Encouraged weight loss   Orders: -     Losartan Potassium; Take 1 tablet (25 mg total) by mouth daily.  Dispense: 90 tablet; Refill: 3  Seasonal allergies -     Albuterol Sulfate HFA; Inhale 1 puff into the lungs every 4 (four) hours as needed for wheezing or shortness of breath.  Dispense: 2 each; Refill: 11 -     EPINEPHrine; Inject 0.3 mg into the muscle as needed for anaphylaxis.  Dispense: 1 each; Refill: 1 -     Fluticasone Propionate; Place 1 spray into both nostrils daily.  Dispense: 10 mL; Refill: 0  History of allergic reaction -     Albuterol Sulfate HFA; Inhale 1 puff into the lungs every 4 (four) hours as needed for wheezing or shortness of breath.  Dispense: 2 each; Refill: 11 -     EPINEPHrine; Inject 0.3 mg into the muscle as needed for anaphylaxis.  Dispense: 1 each; Refill: 1 -     Fluticasone Propionate; Place 1 spray into both nostrils daily.  Dispense: 10 mL; Refill: 0  Allergy to alpha-gal -     Albuterol Sulfate HFA; Inhale 1 puff into the lungs every 4 (four) hours as needed for wheezing or shortness of  breath.  Dispense: 2 each; Refill: 11 -     EPINEPHrine; Inject 0.3 mg into the muscle as needed for anaphylaxis.  Dispense: 1 each; Refill: 1 -     Fluticasone Propionate; Place 1 spray into both nostrils daily.  Dispense: 10 mL; Refill: 0  Abnormal bowel movement Assessment & Plan: This was major complaint Sounds like IBS-D but also may be due to alpha gal allergy Probably needs colonoscopy to rule out inflammatory bowel disease   Orders: -     Ambulatory referral to Gastroenterology  Intractable chronic migraine without aura and with status migrainosus Assessment & Plan: I explained how migraines that are constant like his are treated with 2 medication types preventive and abortive.  I explained the different types of preventative and abortive medications as follows preventative medications include propranolol topiramate and amitriptyline and these are taken daily if different side effects depending on the medicine but I think that topiramate would be the best choice for him because it has a weight loss benefit but at the higher dose the risks are it is going to make him a little  lower processing speed at the high doses but we are just going to start with a low-dose.    The abortive medication issues include Imitrex mainly to start just based on the way insurance companies force it as the first medicine is not necessarily the best but we will have to try it first a lot of people find it to work just great and but basically it is chosen because it is the cheapest the most common side effect is spasms like he can affect chest spasms or even heart spasms it feels weird     Orders: -     Topiramate; Take 1 tablet (25 mg total) by mouth 2 (two) times daily. To start: just 1 tablet daily for a week. Then 1 tablet twice daily long term and important to take consistent - helps with weight loss and preventing migraines  Dispense: 180 tablet; Refill: 3 -     SUMAtriptan Succinate; Take 1 tablet (50  mg total) by mouth every 2 (two) hours as needed for migraine. May repeat in 2 hours if headache persists or recurs.  Dispense: 10 tablet; Refill: 11  Other insomnia -     Ambulatory referral to Sleep Studies     Getting Answers and Following Up  Simple Questions & Concerns: For quick questions or basic follow-up after your visit, reach [place] at [phone] or MyChart messaging. Complex Concerns: If your concern is more complex, scheduling an appointment might be best. Discuss this with the staff to find the most suitable option. Lab & Imaging Results: We'll contact you directly if results are abnormal or you don't use MyChart. Most normal results will be on MyChart within 2-3 business days, with a review message from Dr. Jon Billings. Haven't heard back in 2 weeks? Need results sooner? Contact us at [phone]. Referrals: Our referral coordinator will manage specialist referrals. The specialist's office should contact you within 2 weeks to schedule an appointment. Call us if you haven't heard from them after 2 weeks.  Staying Connected  MyChart: Activate your MyChart for the fastest way to access results and message [place]. See the last page of this paperwork for instructions.  Billing  X-ray & Lab Orders: These are billed by separate companies. Contact the invoicing company directly for questions or concerns. Visit Charges: Discuss any billing inquiries with our administrative services team.  Feedback & Satisfaction  Share Your Experience: We strive for your satisfaction! If you have any complaints, please let Dr. Jon Billings know directly or contact our Practice Administrators, Edwena Felty or [employer], by asking at the front desk.  Scheduling Tips  Shorter Wait Times: 8 am and 1 pm appointments often have the quickest wait times. Longer Appointments: If you need more time during your visit, talk to the front desk. Due to insurance regulations, multiple back-to-back appointments  might be necessary.

## 2022-09-17 NOTE — Assessment & Plan Note (Signed)
This was major complaint Sounds like IBS-D but also may be due to alpha gal allergy Probably needs colonoscopy to rule out inflammatory bowel disease

## 2022-09-27 ENCOUNTER — Other Ambulatory Visit (INDEPENDENT_AMBULATORY_CARE_PROVIDER_SITE_OTHER): Payer: 59

## 2022-09-27 ENCOUNTER — Encounter: Payer: Self-pay | Admitting: Internal Medicine

## 2022-09-27 DIAGNOSIS — Z119 Encounter for screening for infectious and parasitic diseases, unspecified: Secondary | ICD-10-CM

## 2022-09-27 LAB — CBC
HCT: 47.6 % (ref 39.0–52.0)
Hemoglobin: 16.4 g/dL (ref 13.0–17.0)
MCHC: 34.4 g/dL (ref 30.0–36.0)
MCV: 85.2 fl (ref 78.0–100.0)
Platelets: 267 10*3/uL (ref 150.0–400.0)
RBC: 5.59 Mil/uL (ref 4.22–5.81)
RDW: 13.1 % (ref 11.5–15.5)
WBC: 7.8 10*3/uL (ref 4.0–10.5)

## 2022-09-27 LAB — COMPREHENSIVE METABOLIC PANEL
ALT: 40 U/L (ref 0–53)
AST: 25 U/L (ref 0–37)
Albumin: 4.6 g/dL (ref 3.5–5.2)
Alkaline Phosphatase: 79 U/L (ref 39–117)
BUN: 12 mg/dL (ref 6–23)
CO2: 28 mEq/L (ref 19–32)
Calcium: 9.5 mg/dL (ref 8.4–10.5)
Chloride: 103 mEq/L (ref 96–112)
Creatinine, Ser: 1.07 mg/dL (ref 0.40–1.50)
GFR: 91.7 mL/min (ref >60.00)
Glucose, Bld: 93 mg/dL (ref 70–99)
Potassium: 4 mEq/L (ref 3.5–5.1)
Sodium: 138 mEq/L (ref 135–145)
Total Bilirubin: 0.7 mg/dL (ref 0.2–1.2)
Total Protein: 7.2 g/dL (ref 6.0–8.3)

## 2022-09-27 LAB — LIPID PANEL
Cholesterol: 173 mg/dL (ref 0–200)
HDL: 32.7 mg/dL — ABNORMAL LOW (ref >39.00)
NonHDL: 140.04
Total CHOL/HDL Ratio: 5
Triglycerides: 210 mg/dL — ABNORMAL HIGH (ref 0.0–149.0)
VLDL: 42 mg/dL — ABNORMAL HIGH (ref 0.0–40.0)

## 2022-09-27 LAB — LDL CHOLESTEROL, DIRECT: Direct LDL: 113 mg/dL

## 2022-09-27 LAB — TSH: TSH: 2.45 u[IU]/mL (ref 0.35–5.50)

## 2022-09-27 LAB — HEMOGLOBIN A1C: Hgb A1c MFr Bld: 5.7 % (ref 4.6–6.5)

## 2022-09-28 LAB — HIV ANTIBODY (ROUTINE TESTING W REFLEX): HIV 1&2 Ab, 4th Generation: NONREACTIVE

## 2022-09-28 LAB — HEPATITIS C ANTIBODY: Hepatitis C Ab: NONREACTIVE

## 2022-09-29 NOTE — Progress Notes (Signed)
Most of labs normal, except cholesterol. HDL low  32.70 5: Encourage exercise to raise HDL levels. Trig high 210.0: recommend fish oil or Omega-3 fatty acid.  Lower carbohydrates in diet. Cholesterol lowering diet handout:  send if patient desires, I recommend they study it.

## 2022-10-15 ENCOUNTER — Ambulatory Visit (INDEPENDENT_AMBULATORY_CARE_PROVIDER_SITE_OTHER): Payer: 59 | Admitting: Internal Medicine

## 2022-10-15 VITALS — BP 126/98 | HR 67 | Temp 98.6°F | Ht 73.0 in | Wt 276.2 lb

## 2022-10-15 DIAGNOSIS — R110 Nausea: Secondary | ICD-10-CM | POA: Diagnosis not present

## 2022-10-15 DIAGNOSIS — G43711 Chronic migraine without aura, intractable, with status migrainosus: Secondary | ICD-10-CM

## 2022-10-15 DIAGNOSIS — I10 Essential (primary) hypertension: Secondary | ICD-10-CM | POA: Diagnosis not present

## 2022-10-15 DIAGNOSIS — E785 Hyperlipidemia, unspecified: Secondary | ICD-10-CM | POA: Diagnosis not present

## 2022-10-15 DIAGNOSIS — R5383 Other fatigue: Secondary | ICD-10-CM

## 2022-10-15 HISTORY — DX: Hyperlipidemia, unspecified: E78.5

## 2022-10-15 MED ORDER — LOSARTAN POTASSIUM 50 MG PO TABS: 50.0000 mg | ORAL_TABLET | Freq: Every day | ORAL | 3 refills | Status: DC

## 2022-10-15 MED ORDER — ELETRIPTAN HYDROBROMIDE 40 MG PO TABS
40.0000 mg | ORAL_TABLET | ORAL | 0 refills | Status: AC | PRN
Start: 2022-10-15 — End: ?

## 2022-10-15 MED ORDER — ROSUVASTATIN CALCIUM 20 MG PO TABS: 20.0000 mg | ORAL_TABLET | Freq: Every day | ORAL | 3 refills | Status: DC

## 2022-10-15 MED ORDER — TOPIRAMATE 25 MG PO TABS
50.0000 mg | ORAL_TABLET | Freq: Two times a day (BID) | ORAL | 3 refills | Status: DC
Start: 2022-10-15 — End: 2024-03-16

## 2022-10-15 MED ORDER — ONDANSETRON HCL 4 MG PO TABS: 4.0000 mg | ORAL_TABLET | Freq: Three times a day (TID) | ORAL | 0 refills | Status: DC | PRN

## 2022-10-15 NOTE — Patient Instructions (Addendum)
It was a pleasure seeing you today! Your health and satisfaction are our top priorities.   William Hew, MD  Next Steps:  [x]  Early Intervention: Schedule sooner appointment, call our on-call services, or go to emergency room if there is Increase in pain or discomfort New or worsening symptoms Sudden or severe changes in your health [x]  Flexible Follow-Up: We recommend a Return in about 1 month (around 11/15/2022) for follow up migrains, weight loss blood pressure cholesterol and testosterone.. for optimal routine care. This allows for progress monitoring and treatment adjustments. [x]  Preventive Care: Schedule your annual preventive care visit! It's typically covered by insurance and helps identify potential health issues early. [x]  Lab & X-ray Appointments: Incomplete tests scheduled today, or call to schedule. X-rays:  [facility] (M-F, 8:30am-noon or 1pm-5pm). [x]  Medical Information Release: Sign a release form at front desk to obtain relevant medical information we don't have.  Making the Most of Our Focused (20 minute) Appointments:  [x]   Clearly state your top concerns at the beginning of the visit to focus our discussion [x]   If you anticipate you will need more time, please inform the front desk during scheduling - we can book multiple appointments in the same week. [x]   If you have transportation problems- use our convenient video appointments or ask about transportation support. [x]   We can get down to business faster if you use MyChart to update information before the visit and submit non-urgent questions before your visit. Thank you for taking the time to provide details through MyChart.  Let our nurse know and she can import this information into your encounter documents.  Arrival and Wait Times: [x]   Arriving on time ensures that everyone receives prompt attention. [x]   Early morning (8a) and afternoon (1p) appointments tend to have shortest wait times. [x]    Unfortunately, we cannot delay appointments for late arrivals or hold slots during phone calls.  Getting Answers and Following Up  [x]   Simple Questions & Concerns: For quick questions or basic follow-up after your visit, reach [place] at [phone] or MyChart messaging. [x]   Complex Concerns: If your concern is more complex, scheduling an appointment might be best. Discuss this with the staff to find the most suitable option. [x]   Lab & Imaging Results: We'll contact you directly if results are abnormal or you don't use MyChart. Most normal results will be on MyChart within 2-3 business days, with a review message from Dr. Jon Billings. Haven't heard back in 2 weeks? Need results sooner? Contact us at [phone]. [x]   Referrals: Our referral coordinator will manage specialist referrals. The specialist's office should contact you within 2 weeks to schedule an appointment. Call us if you haven't heard from them after 2 weeks.  Staying Connected  [x]   MyChart: Activate your MyChart for the fastest way to access results and message [place]. See the last page of this paperwork for instructions on how to activate.  Bring to Your Next Appointment  [x]   Medications: Please bring all your medication bottles to your next appointment to ensure we have an accurate record of your prescriptions. [x]   Health Diaries: If you're monitoring any health conditions at home, keeping a diary of your readings can be very helpful for discussions at your next appointment.  Billing  [x]   X-ray & Lab Orders: These are billed by separate companies. Contact the invoicing company directly for questions or concerns. [x]   Visit Charges: Discuss any billing inquiries with our administrative services team.  Your Satisfaction Matters  [x]   Share Your Experience: We strive for your satisfaction! If you have any complaints, or preferably compliments, please let Dr. Jon Billings know directly or contact our Practice Administrators,  Edwena Felty or [employer], by asking at the front desk.   Reviewing Your Records  [x]   Review this early draft of your clinical encounter notes below and the final encounter summary tomorrow on MyChart after its been completed.   Hyperlipidemia, unspecified hyperlipidemia type Assessment & Plan: There is some cardiovascular risk in the family and he has low HDL and high triglycerides so even though he does not meet criteria for starting statin by cardiovascular risk yet he would prefer to go ahead and get started to which I agreed to prescribe it  Orders: -     Rosuvastatin Calcium; Take 1 tablet (20 mg total) by mouth daily.  Dispense: 90 tablet; Refill: 3  Intractable chronic migraine without aura and with status migrainosus Assessment & Plan: Uncontrolled  but dose topiramate too low Offered neurology referral, patient declined to move forward with this after brief discussion of benefits.  Increase topiramate for also weight loss as well Had nausea with Imitrex try Relpax and provide Zofran in case of recurrence of nausea with these.  Orders: -     Eletriptan Hydrobromide; Take 1 tablet (40 mg total) by mouth as needed for migraine or headache. May repeat in 2 hours if headache persists or recurs.  Dispense: 10 tablet; Refill: 0 -     Topiramate; Take 2 tablets (50 mg total) by mouth 2 (two) times daily. Slowly increase from current dose by 25 mg per day each week.  Dispense: 360 tablet; Refill: 3  Hypertension, unspecified type Assessment & Plan: Reviewed available data from patient and  BP Readings from Last 3 Encounters:  10/15/22 (!) 126/98  09/17/22 (!) 130/98  02/04/21 137/89   My individualized, goal average blood pressure for this patient, after considering the evidence for and against aggressive blood pressure goals as well as their past medical history and preferences, is 140/90 In my medical opinion, this problem is stable, inadequately controlled  Increased  medication(s) prescribed after collaborative review. Following informed consent, we adjusted the medication regimen as per documented orders to now be:  Current hypertension medications:       Sig   losartan (COZAAR) 50 MG tablet Take 1 tablet (50 mg total) by mouth daily.     We explained the expected benefits and potential side effects of increasing losartan to 50 mg daily and encouraged the patient to report any concerns.  Information for patient review: Please limit and avoid: salt, alcohol, NSAIDS, excess body weight, smoking, stress, sedentary lifestyles The risks of poor control over time are FUTURE stroke and heart attacks, but if you have a blood pressure over 180 and any red flag symptoms(headache/shortness of breath/confusion/chest discomfort) you should go to the ER.  You are encouraged to do home blood pressure monitoring, at least as many times per week as blood pressure medications you are on.  For example, bring a diary with 3 home blood pressure readings per week to each visit if you are on 3 blood pressure medications.   If you are on more than 3 medication(s) or have certain risk factors, we should do a resistant hypertension workup See AFTER VISIT SUMMARY for addition educational information provided Please let me know in advance when you need medication(s) refills, consistently taking your medication is very important!   Orders: -  Losartan Potassium; Take 1 tablet (50 mg total) by mouth daily.  Dispense: 90 tablet; Refill: 3 -     Basic metabolic panel  Morbid obesity (HCC) Assessment & Plan: We will incorporate routine monitoring of body metrics as a proactive approach to health maintenance at future visits. Encouraged the continuation of current health-positive behaviors and am available for additional supportive measures as desired. I support waist circumference monitoring rather than focusing on weight, to avoid unhealthy weight loss (muscle loss).  Patient  will continue with topiramate ramp and metformin for now- offered call in GLP-1 agonist but its not covered by his insurance and he cannot afford presently  Orders: -     Testosterone  Other fatigue -     Testosterone  Nausea -     Ondansetron HCl; Take 1 tablet (4 mg total) by mouth every 8 (eight) hours as needed for nausea or vomiting.  Dispense: 20 tablet; Refill: 0

## 2022-10-15 NOTE — Assessment & Plan Note (Signed)
There is some cardiovascular risk in the family and he has low HDL and high triglycerides so even though he does not meet criteria for starting statin by cardiovascular risk yet he would prefer to go ahead and get started to which I agreed to prescribe it

## 2022-10-15 NOTE — Assessment & Plan Note (Signed)
Reviewed available data from patient and  BP Readings from Last 3 Encounters:  10/15/22 (!) 126/98  09/17/22 (!) 130/98  02/04/21 137/89   My individualized, goal average blood pressure for this patient, after considering the evidence for and against aggressive blood pressure goals as well as their past medical history and preferences, is 140/90 In my medical opinion, this problem is stable, inadequately controlled  Increased medication(s) prescribed after collaborative review. Following informed consent, we adjusted the medication regimen as per documented orders to now be:  Current hypertension medications:       Sig   losartan (COZAAR) 50 MG tablet Take 1 tablet (50 mg total) by mouth daily.     We explained the expected benefits and potential side effects of increasing losartan to 50 mg daily and encouraged the patient to report any concerns.  Information for patient review: Please limit and avoid: salt, alcohol, NSAIDS, excess body weight, smoking, stress, sedentary lifestyles The risks of poor control over time are FUTURE stroke and heart attacks, but if you have a blood pressure over 180 and any red flag symptoms(headache/shortness of breath/confusion/chest discomfort) you should go to the ER.  You are encouraged to do home blood pressure monitoring, at least as many times per week as blood pressure medications you are on.  For example, bring a diary with 3 home blood pressure readings per week to each visit if you are on 3 blood pressure medications.   If you are on more than 3 medication(s) or have certain risk factors, we should do a resistant hypertension workup See AFTER VISIT SUMMARY for addition educational information provided Please let me know in advance when you need medication(s) refills, consistently taking your medication is very important!

## 2022-10-15 NOTE — Assessment & Plan Note (Addendum)
Uncontrolled  but dose topiramate too low Offered neurology referral, patient declined to move forward with this after brief discussion of benefits.  Increase topiramate for also weight loss as well Had nausea with Imitrex try Relpax and provide Zofran in case of recurrence of nausea with these.
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
## 2022-10-15 NOTE — Assessment & Plan Note (Signed)
We will incorporate routine monitoring of body metrics as a proactive approach to health maintenance at future visits. Encouraged the continuation of current health-positive behaviors and am available for additional supportive measures as desired. I support waist circumference monitoring rather than focusing on weight, to avoid unhealthy weight loss (muscle loss).  Patient will continue with topiramate ramp and metformin for now- offered call in GLP-1 agonist but its not covered by his insurance and he cannot afford presently

## 2022-10-16 LAB — TESTOSTERONE: Testosterone: 329.2 ng/dL (ref 300.00–890.00)

## 2022-10-18 ENCOUNTER — Institutional Professional Consult (permissible substitution): Payer: 59 | Admitting: Neurology

## 2022-11-19 ENCOUNTER — Ambulatory Visit: Payer: 59 | Admitting: Internal Medicine

## 2022-11-19 ENCOUNTER — Encounter: Payer: Self-pay | Admitting: Internal Medicine

## 2022-11-22 ENCOUNTER — Encounter: Payer: Self-pay | Admitting: Neurology

## 2022-11-22 ENCOUNTER — Institutional Professional Consult (permissible substitution): Payer: 59 | Admitting: Neurology

## 2023-04-27 DIAGNOSIS — J029 Acute pharyngitis, unspecified: Secondary | ICD-10-CM | POA: Diagnosis not present

## 2023-04-27 DIAGNOSIS — J020 Streptococcal pharyngitis: Secondary | ICD-10-CM | POA: Diagnosis not present

## 2023-04-27 DIAGNOSIS — I10 Essential (primary) hypertension: Secondary | ICD-10-CM | POA: Diagnosis not present

## 2023-04-27 DIAGNOSIS — Z20822 Contact with and (suspected) exposure to covid-19: Secondary | ICD-10-CM | POA: Diagnosis not present

## 2023-04-28 DIAGNOSIS — R079 Chest pain, unspecified: Secondary | ICD-10-CM | POA: Diagnosis not present

## 2023-04-28 DIAGNOSIS — F17210 Nicotine dependence, cigarettes, uncomplicated: Secondary | ICD-10-CM | POA: Diagnosis not present

## 2023-04-28 DIAGNOSIS — R0789 Other chest pain: Secondary | ICD-10-CM | POA: Diagnosis not present

## 2023-04-28 DIAGNOSIS — I10 Essential (primary) hypertension: Secondary | ICD-10-CM | POA: Diagnosis not present

## 2023-04-28 DIAGNOSIS — Z880 Allergy status to penicillin: Secondary | ICD-10-CM | POA: Diagnosis not present

## 2023-04-28 DIAGNOSIS — Z888 Allergy status to other drugs, medicaments and biological substances status: Secondary | ICD-10-CM | POA: Diagnosis not present

## 2023-04-29 ENCOUNTER — Encounter: Payer: Self-pay | Admitting: Internal Medicine

## 2023-04-29 DIAGNOSIS — M940 Chondrocostal junction syndrome [Tietze]: Secondary | ICD-10-CM

## 2023-04-29 DIAGNOSIS — Z91148 Patient's other noncompliance with medication regimen for other reason: Secondary | ICD-10-CM

## 2023-04-29 DIAGNOSIS — I10 Essential (primary) hypertension: Secondary | ICD-10-CM

## 2023-04-29 DIAGNOSIS — D72829 Elevated white blood cell count, unspecified: Secondary | ICD-10-CM

## 2023-04-29 NOTE — Telephone Encounter (Signed)
MyChart secure digital messaging clinical encounter  Chief Complaint: Follow-up for persistent left-sided chest pain after ED evaluation  Relevant History: - 32 year old male with undertreated hypertension - ED visit 04/28/23 for left-sided chest pain - Recent URI symptoms with urgent care visit 4 days prior - Received azithromycin and IM steroids at urgent care - Notable for medication non-adherence with antihypertensives - Alpha-gal allergy requiring medication considerations - Social: Non-daily smoker (0.5 PPD), occasional alcohol - Left ED on 04/15/23 after triage for similar symptoms  Comprehensive ED Data Review: 1. Vital Signs:    - BP 144/87 (elevated, no prior readings available)    - HR 95, RR 16, T 98.44F, SpO2 95%    - Weight 127 kg, Height 1.854 m  2. Laboratory Findings:    - WBC 25,000 with neutrophilic predominance (81.3%)      * Likely post-steroid effect vs. inflammatory response    - Hemoglobin 16.2, Platelets 340    - Comprehensive metabolic panel unremarkable except:      * Mild hyperglycemia (129)      * Low anion gap (5)    - Cardiac enzymes negative (Troponin I <0.034)    - Normal lipase (63)  3. Diagnostic Studies:    - EKG: NSR at 94 bpm      * Normal intervals (PR , QRS 84ms, QTc )      * No ST-T wave changes concerning for ischemia    - Chest X-ray: No acute cardiopulmonary process      * No infiltrates or effusions      * Normal cardiac silhouette  4. Physical Examination Findings:    - Reproducible left anterior chest wall tenderness at costosternal margin    - Epigastric tenderness    - No acute distress    - Normal cardiopulmonary examination  Assessment: 1. Acute Musculoskeletal Chest Pain    - Most consistent with costochondritis given:      * Reproducible chest wall tenderness      * Recent URI symptoms as typical precipitant      * Normal cardiac/pulmonary workup      * Age and risk factor profile    - Differential  considerations:      * Pleuritic component given recent URI symptoms      * GERD component given epigastric tenderness      * Anxiety-related component possible  2. Hypertension, Poorly Controlled    - Currently untreated due to medication non-adherence    - ED BP elevated at 144/87    - Increased cardiovascular risk given:      * Smoking history      * Obesity (BMI 37.1)      * Medication non-compliance  3. Acute Leukocytosis (WBC 25,000)    - Most likely post-steroid effect given:      * Recent IM steroid administration      * Neutrophilic predominance      * Absence of fever or severe symptoms    - Will trend with follow-up labs  Plan: 1. Musculoskeletal Pain Management:    - Continue lidocaine patch    - Scheduled Tylenol for anti-inflammatory effect    - Heat therapy    - Activity modification counseling    - Earlier follow-up scheduled  2. Hypertension Management:    - Restart Losartan immediately    - Addressed importance of medication adherence    - Will check BP at follow-up visit    - Smoking cessation counseling  indicated  3. GERD Management:    - Continue Pantoprazole BID    - Dietary and lifestyle modifications reviewed    - Will reassess at follow-up  4. Monitoring Plan:    - Earlier follow-up appointment requested    - CBC trend at follow-up    - Detailed return precautions provided  Medical Decision Making: Moderate complexity supported by: 1. Number of Diagnoses: Multiple (chest pain, HTN, leukocytosis) 2. Data Reviewed:    - Comprehensive ED records    - Multiple diagnostic tests    - Laboratory studies 3. Risk Assessment:    - Moderate risk due to:      * Presentation of chest pain requiring evaluation      * Poorly controlled hypertension      * Medication non-adherence      * Multiple social/behavioral factors  Clinical Guidelines Reference: 1. ACC/AHA 2021 Chest Pain Guidelines:    - Evaluation consistent with low-risk chest pain  algorithm    - Appropriate cardiac risk stratification performed     2. AHA/ACC/JNC8 Hypertension Guidelines:    - BP target <130/80 for primary prevention    - Emphasis on medication adherence    - Lifestyle modification recommendations  Please see the MyChart message reply(ies) for my assessment and plan.  This patient gave consent for this Medical Advice Message and is aware that it may result in a bill to [employer], as well as the possibility of receiving a bill for a co-payment or deductible. They are an established patient, but are not seeking medical advice exclusively about a problem treated during an in person or video visit in the last seven days. I did not recommend an in person or video visit within seven days of my reply.  I spent a total of 25 minutes cumulative time within 7 days through MyChart messaging: - Review of ED records and labs: 10 minutes - Analysis and care planning: 8 minutes - Patient education and communication: 7 minutes  Lula Olszewski, MD  Diagnoses from ER note review: 1. Add: Costochondritis 2. Add: Medication non-adherence 3. Update: Hypertension - poorly controlled 4. Add: Acute leukocytosis, post-steroid 5. Add: Obesity (BMI 37.1)

## 2023-04-30 NOTE — Telephone Encounter (Signed)
Patient scheduled for an OV on 12/4 (tomorrow).

## 2023-05-01 ENCOUNTER — Ambulatory Visit (INDEPENDENT_AMBULATORY_CARE_PROVIDER_SITE_OTHER): Payer: 59 | Admitting: Internal Medicine

## 2023-05-01 ENCOUNTER — Encounter: Payer: Self-pay | Admitting: Internal Medicine

## 2023-05-01 VITALS — BP 107/70 | HR 92 | Temp 99.0°F | Ht 73.0 in | Wt 276.6 lb

## 2023-05-01 DIAGNOSIS — F411 Generalized anxiety disorder: Secondary | ICD-10-CM | POA: Diagnosis not present

## 2023-05-01 DIAGNOSIS — F17209 Nicotine dependence, unspecified, with unspecified nicotine-induced disorders: Secondary | ICD-10-CM

## 2023-05-01 DIAGNOSIS — F439 Reaction to severe stress, unspecified: Secondary | ICD-10-CM | POA: Insufficient documentation

## 2023-05-01 DIAGNOSIS — K2100 Gastro-esophageal reflux disease with esophagitis, without bleeding: Secondary | ICD-10-CM

## 2023-05-01 DIAGNOSIS — R638 Other symptoms and signs concerning food and fluid intake: Secondary | ICD-10-CM | POA: Diagnosis not present

## 2023-05-01 DIAGNOSIS — Z599 Problem related to housing and economic circumstances, unspecified: Secondary | ICD-10-CM | POA: Diagnosis not present

## 2023-05-01 DIAGNOSIS — I10 Essential (primary) hypertension: Secondary | ICD-10-CM | POA: Diagnosis not present

## 2023-05-01 MED ORDER — LORAZEPAM 0.5 MG PO TABS: 0.5000 mg | ORAL_TABLET | Freq: Two times a day (BID) | ORAL | 0 refills | Status: DC | PRN

## 2023-05-01 MED ORDER — OMEPRAZOLE 20 MG PO CPDR: 20.0000 mg | DELAYED_RELEASE_CAPSULE | Freq: Every day | ORAL | 3 refills | Status: DC

## 2023-05-01 MED ORDER — ESCITALOPRAM OXALATE 10 MG PO TABS: 10.0000 mg | ORAL_TABLET | Freq: Every day | ORAL | 3 refills | Status: DC

## 2023-05-01 NOTE — Assessment & Plan Note (Signed)
Generalized Anxiety Disorder (F41.1) Status: Active, New diagnosis Assessment: Moderate anxiety symptoms in context of significant psychosocial stressors affecting sleep, eating patterns, and leading to smoking relapse. Anxiety score indicates moderate severity requiring pharmacologic intervention. Plan:  Start escitalopram 10mg  daily (5mg  daily for first 2 weeks) Start lorazepam 0.5mg  BID PRN for acute anxiety Counseling referral available if needed Follow up in one month to assess response Warned about benzodiazepine dependence risk

## 2023-05-01 NOTE — Assessment & Plan Note (Signed)
Obesity (E66.9) Status: Active, Chronic Assessment: Contributor to reflux symptoms and overall health risks Plan:  Discussed weight loss strategies Will consider weight loss medications at follow-up Encouraged physical activity as tolerated Dietary counseling provided
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
## 2023-05-01 NOTE — Assessment & Plan Note (Signed)
Gastroesophageal Reflux Disease with Esophagitis (K21.0) Status: Active, New diagnosis Assessment: Patient presents with classic reflux symptoms including epigastric/substernal pain worsened by eating. Clinical picture suggests stress-induced gastritis/esophagitis, potentially complicated by hiatal hernia given symptom complex and body habitus. Recent negative cardiac workup supports this as primary diagnosis. Plan:  Start omeprazole 20mg  daily for acid suppression Dietary modifications: avoid spicy foods, eat smaller/softer meals Consider GI referral if symptoms persist beyond 4-6 weeks Weight loss counseling to reduce intra-abdominal pressure Monitor for warning signs including dysphagia, early satiety

## 2023-05-01 NOTE — Assessment & Plan Note (Signed)
Essential Hypertension (I10) Status: Active, Suboptimally controlled Assessment: Blood pressure elevated during recent illness but improving with resumed medication compliance. No end-organ damage evident. Plan:  Continue current antihypertensive regimen Emphasized medication compliance Lifestyle modifications including sodium restriction Monitor BP at home when possible

## 2023-05-01 NOTE — Assessment & Plan Note (Signed)
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 Motivated to quit but struggling with current stressors. Plan:  Smoking cessation counseling provided Will consider pharmacotherapy at follow-up if ready Discussed stress management strategies Consider nicotine replacement therapy

## 2023-05-01 NOTE — Patient Instructions (Signed)
After Visit Summary  Your Health Conditions: 1. Acid Reflux/Esophagitis 2. Anxiety 3. High Blood Pressure 4. Smoking/Nicotine Dependence 5. Obesity  New Medications: 1. Omeprazole (Prilosec) 20mg  capsule    - Take 1 capsule by mouth daily    - Take on empty stomach    - Purpose: Reduce stomach acid to help healing  2. Escitalopram (Lexapro) 10mg  tablet    - Take 1/2 tablet daily for first 2 weeks, then increase to 1 tablet daily    - Take in the morning    - Purpose: Help prevent anxiety    - Note: Takes 2-3 weeks to see full benefit  3. Lorazepam (Ativan) 0.5mg  tablet    - Take 1 tablet by mouth up to twice daily AS NEEDED for severe anxiety    - DO NOT drive or operate machinery while taking    - DO NOT drink alcohol while taking    - Purpose: Quick relief of severe anxiety  Important Instructions: - For chest/stomach pain:   * Avoid spicy foods   * Eat smaller, softer meals   * Stay upright for 30 minutes after eating   * No eating 2-3 hours before bed  - For anxiety management:   * Take medications as prescribed   * Practice stress management techniques   * Avoid smoking as stress relief   * Get adequate sleep when possible  Warning Signs - Seek Immediate Care If: - Severe chest pain - Difficulty swallowing - Vomiting blood - Shortness of breath - Severe anxiety not improved with medication  Follow-up Appointment: Schedule follow-up visit in one month  Contact office if: - Symptoms worsen - Side effects from medications - Questions about your treatment plan  Remember: - Take blood pressure medication daily - Work on stress management - Consider smoking cessation when ready - Start gentle exercise as able - Make dietary changes as discussed  Your next appointment is scheduled for 06/06/2023 at 10:20 AM

## 2023-05-10 ENCOUNTER — Ambulatory Visit: Payer: 59 | Admitting: Internal Medicine

## 2023-05-24 DIAGNOSIS — R079 Chest pain, unspecified: Secondary | ICD-10-CM | POA: Diagnosis not present

## 2023-06-06 ENCOUNTER — Ambulatory Visit: Payer: 59 | Admitting: Internal Medicine

## 2023-06-17 ENCOUNTER — Ambulatory Visit: Payer: 59 | Admitting: Internal Medicine

## 2023-09-20 DIAGNOSIS — M5412 Radiculopathy, cervical region: Secondary | ICD-10-CM | POA: Diagnosis not present

## 2023-09-20 DIAGNOSIS — M79601 Pain in right arm: Secondary | ICD-10-CM | POA: Diagnosis not present

## 2023-09-20 DIAGNOSIS — M7521 Bicipital tendinitis, right shoulder: Secondary | ICD-10-CM | POA: Diagnosis not present

## 2023-09-20 DIAGNOSIS — M7711 Lateral epicondylitis, right elbow: Secondary | ICD-10-CM | POA: Diagnosis not present

## 2023-09-25 ENCOUNTER — Other Ambulatory Visit: Payer: Self-pay

## 2023-09-25 DIAGNOSIS — I10 Essential (primary) hypertension: Secondary | ICD-10-CM

## 2023-09-25 MED ORDER — METFORMIN HCL 500 MG PO TABS: 500.0000 mg | ORAL_TABLET | Freq: Two times a day (BID) | ORAL | 3 refills | Status: DC

## 2023-09-25 MED ORDER — LOSARTAN POTASSIUM 50 MG PO TABS
50.0000 mg | ORAL_TABLET | Freq: Every day | ORAL | 3 refills | Status: DC
Start: 2023-09-25 — End: 2024-03-16

## 2023-09-26 ENCOUNTER — Other Ambulatory Visit: Payer: Self-pay | Admitting: Internal Medicine

## 2023-10-11 ENCOUNTER — Other Ambulatory Visit: Payer: Self-pay | Admitting: Internal Medicine

## 2023-10-11 DIAGNOSIS — K2100 Gastro-esophageal reflux disease with esophagitis, without bleeding: Secondary | ICD-10-CM

## 2024-03-16 ENCOUNTER — Ambulatory Visit (INDEPENDENT_AMBULATORY_CARE_PROVIDER_SITE_OTHER): Payer: Self-pay | Admitting: Internal Medicine

## 2024-03-16 ENCOUNTER — Encounter: Payer: Self-pay | Admitting: Internal Medicine

## 2024-03-16 VITALS — BP 140/100 | HR 80 | Temp 98.0°F | Ht 73.0 in | Wt 285.6 lb

## 2024-03-16 DIAGNOSIS — K2100 Gastro-esophageal reflux disease with esophagitis, without bleeding: Secondary | ICD-10-CM

## 2024-03-16 DIAGNOSIS — F4322 Adjustment disorder with anxiety: Secondary | ICD-10-CM

## 2024-03-16 DIAGNOSIS — Z599 Problem related to housing and economic circumstances, unspecified: Secondary | ICD-10-CM

## 2024-03-16 DIAGNOSIS — F411 Generalized anxiety disorder: Secondary | ICD-10-CM

## 2024-03-16 DIAGNOSIS — Z5971 Insufficient health insurance coverage: Secondary | ICD-10-CM | POA: Insufficient documentation

## 2024-03-16 DIAGNOSIS — F439 Reaction to severe stress, unspecified: Secondary | ICD-10-CM

## 2024-03-16 DIAGNOSIS — J302 Other seasonal allergic rhinitis: Secondary | ICD-10-CM

## 2024-03-16 DIAGNOSIS — E785 Hyperlipidemia, unspecified: Secondary | ICD-10-CM

## 2024-03-16 DIAGNOSIS — I10 Essential (primary) hypertension: Secondary | ICD-10-CM

## 2024-03-16 DIAGNOSIS — Z91018 Allergy to other foods: Secondary | ICD-10-CM

## 2024-03-16 DIAGNOSIS — Z889 Allergy status to unspecified drugs, medicaments and biological substances status: Secondary | ICD-10-CM

## 2024-03-16 DIAGNOSIS — I159 Secondary hypertension, unspecified: Secondary | ICD-10-CM

## 2024-03-16 MED ORDER — LOSARTAN POTASSIUM 50 MG PO TABS
50.0000 mg | ORAL_TABLET | Freq: Every day | ORAL | 3 refills | Status: AC
Start: 2024-03-16 — End: ?

## 2024-03-16 MED ORDER — OMEPRAZOLE 40 MG PO CPDR: 40.0000 mg | DELAYED_RELEASE_CAPSULE | Freq: Every day | ORAL | 3 refills | Status: AC

## 2024-03-16 MED ORDER — ROSUVASTATIN CALCIUM 20 MG PO TABS: 20.0000 mg | ORAL_TABLET | Freq: Every day | ORAL | 3 refills | Status: AC

## 2024-03-16 MED ORDER — ALBUTEROL SULFATE HFA 108 (90 BASE) MCG/ACT IN AERS: 1.0000 | INHALATION_SPRAY | RESPIRATORY_TRACT | 11 refills | Status: AC | PRN

## 2024-03-16 MED ORDER — FLUTICASONE PROPIONATE 50 MCG/ACT NA SUSP: 1.0000 | Freq: Every day | NASAL | 0 refills | Status: AC

## 2024-03-16 MED ORDER — ESCITALOPRAM OXALATE 20 MG PO TABS: 20.0000 mg | ORAL_TABLET | Freq: Every day | ORAL | 4 refills | Status: AC

## 2024-03-16 MED ORDER — EPINEPHRINE 0.3 MG/0.3ML IJ SOAJ: 0.3000 mg | INTRAMUSCULAR | 1 refills | Status: AC | PRN

## 2024-03-16 MED ORDER — PROPRANOLOL HCL ER 60 MG PO CP24: 60.0000 mg | ORAL_CAPSULE | Freq: Every day | ORAL | 4 refills | Status: AC

## 2024-03-16 MED ORDER — SUCRALFATE 1 G PO TABS: 1.0000 g | ORAL_TABLET | Freq: Three times a day (TID) | ORAL | 3 refills | Status: AC

## 2024-03-16 MED ORDER — LORAZEPAM 0.5 MG PO TABS: 0.5000 mg | ORAL_TABLET | Freq: Two times a day (BID) | ORAL | 0 refills | Status: AC | PRN

## 2024-03-16 NOTE — Assessment & Plan Note (Signed)
 Social Determinants of Health: Psychologist, clinical Crisis   He is experiencing a financial and insurance crisis after losing health insurance when his wife stopped working. As a self-employed Administrator, he lacks Sports administrator. Medicaid application was denied due to income levels. He is exploring subsidized insurance options on the government exchange and advised to reapply for Medicaid once his wife's income is no longer considered. The potential impact of a government shutdown on insurance subsidies and the unaffordability of COBRA were discussed. Consideration of [employer]-based housing and food pantries for cost savings was advised. He will explore subsidized insurance plans, reapply for Medicaid, consider [employer]-based housing, utilize food pantries, and has been referred to a Child psychotherapist for assistance with insurance and financial issues.  Follow-up is contingent on resolving insurance issues. Labs are deferred until insurance is secured to avoid high costs. He will return for follow-up once insurance is secured through the exchange or Medicaid and consider labs once insurance is in place.

## 2024-03-16 NOTE — Assessment & Plan Note (Signed)
 He will continue Flonase  for allergy management.
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
## 2024-03-16 NOTE — Patient Instructions (Addendum)
 It was a pleasure seeing you today! Your health and satisfaction are our top priorities.  Bernardino Cone, MD  VISIT SUMMARY: During your visit, we discussed your concerns about high blood pressure, medication management, and the impact of losing health insurance. We also addressed your financial and insurance crisis, anxiety, stomach ulcers, and allergies.  YOUR PLAN: -FINANCIAL AND INSURANCE CRISIS: You are experiencing a financial and insurance crisis after losing health insurance when your wife stopped working. You are advised to explore subsidized insurance options on the government exchange, reapply for Medicaid, consider HUD-based housing, and utilize food pantries for cost savings. You have been referred to a Child psychotherapist for assistance with insurance and financial issues.  -ESSENTIAL HYPERTENSION: Hypertension, or high blood pressure, increases the risk of heart attack or stroke. It is important to manage your blood pressure, especially given your current stressors. You will continue taking propranolol and losartan  for blood pressure management. We have provided a year's supply of medications to avoid frequent visits.  -GENERALIZED ANXIETY DISORDER: Anxiety can be worsened by financial and insurance stressors. We have increased your Lexapro  dosage to 20 mg for better control. Ativan  is prescribed for acute anxiety episodes, but please use it cautiously due to its addictive potential and possible impact on your marriage dynamics.  -GASTROESOPHAGEAL REFLUX DISEASE WITH ESOPHAGITIS AND SUSPECTED STRESS ULCER: GERD and stress ulcers can cause pain below the sternum. We have increased your omeprazole  dosage to 40 mg daily and prescribed sucralfate to protect your stomach lining. Propranolol will also help reduce bleeding risk from the ulcer.  -ALLERGIC RHINITIS: Allergic rhinitis, or nasal allergies, can cause symptoms like a runny or stuffy nose. You will continue using Flonase  for allergy  management.  INSTRUCTIONS: Follow-up is contingent on resolving your insurance issues. Labs are deferred until insurance is secured to avoid high costs. Please return for follow-up once insurance is secured through the exchange or Medicaid, and we will consider labs at that time.  Your Providers PCP: Cone Bernardino MATSU, MD,  [phone]) Referring Provider: Cone Bernardino MATSU, MD,  [phone])  NEXT STEPS: [x]  Early Intervention: Schedule sooner appointment, call our on-call services, or go to emergency room if there is any significant Increase in pain or discomfort New or worsening symptoms Sudden or severe changes in your health [x]  Flexible Follow-Up: We recommend a No follow-ups on file. for optimal routine care. This allows for progress monitoring and treatment adjustments. [x]  Preventive Care: Schedule your annual preventive care visit! It's typically covered by insurance and helps identify potential health issues early. [x]  Lab & X-ray Appointments: Incomplete tests scheduled today, or call to schedule. X-rays: [facility] (M-F, 8:30am-noon or 1pm-5pm). [x]  Medical Information Release: Sign a release form at front desk to obtain relevant medical information we don't have.  MAKING THE MOST OF OUR FOCUSED 20 MINUTE APPOINTMENTS: [x]   Clearly state your top concerns at the beginning of the visit to focus our discussion [x]   If you anticipate you will need more time, please inform the front desk during scheduling - we can book multiple appointments in the same week. [x]   If you have transportation problems- use our convenient video appointments or ask about transportation support. [x]   We can get down to business faster if you use MyChart to update information before the visit and submit non-urgent questions before your visit. Thank you for taking the time to provide details through MyChart.  Let our nurse know and she can import this information into your encounter  documents.  Arrival and Wait Times: [x]   Arriving on time ensures that everyone receives prompt attention. [x]   Early morning (8a) and afternoon (1p) appointments tend to have shortest wait times. [x]   Unfortunately, we cannot delay appointments for late arrivals or hold slots during phone calls.  Getting Answers and Following Up [x]   Simple Questions & Concerns: For quick questions or basic follow-up after your visit, reach us  at [phone] or MyChart messaging. [x]   Complex Concerns: If your concern is more complex, scheduling an appointment might be best. Discuss this with the staff to find the most suitable option. [x]   Lab & Imaging Results: We'll contact you directly if results are abnormal or you don't use MyChart. Most normal results will be on MyChart within 2-3 business days, with a review message from Dr. Jesus. Haven't heard back in 2 weeks? Need results sooner? Contact us  at [phone]. [x]   Referrals: Our referral coordinator will manage specialist referrals. The specialist's office should contact you within 2 weeks to schedule an appointment. Call us  if you haven't heard from them after 2 weeks.  Staying Connected [x]   MyChart: Activate your MyChart for the fastest way to access results and message us . See the last page of this paperwork for instructions on how to activate.  Bring to Your Next Appointment [x]   Medications: Please bring all your medication bottles to your next appointment to ensure we have an accurate record of your prescriptions. [x]   Health Diaries: If you're monitoring any health conditions at home, keeping a diary of your readings can be very helpful for discussions at your next appointment.  Billing [x]   X-ray & Lab Orders: These are billed by separate companies. Contact the invoicing company directly for questions or concerns. [x]   Visit Charges: Discuss any billing inquiries with our administrative services team.  Your Satisfaction Matters [x]    Share Your Experience: We strive for your satisfaction! If you have any complaints, or preferably compliments, please let Dr. Jesus know directly or contact our Practice Administrators, Manuelita Rubin or [employer], by asking at the front desk.   Reviewing Your Records [x]   Review this early draft of your clinical encounter notes below and the final encounter summary tomorrow on MyChart after its been completed.  All orders placed so far are visible here: Financial difficulties -     AMB Referral VBCI Care Management -     Escitalopram  Oxalate; Take 1 tablet (20 mg total) by mouth daily.  Dispense: 90 tablet; Refill: 4  Does not have health insurance -     AMB Referral VBCI Care Management  Seasonal allergies -     Albuterol  Sulfate HFA; Inhale 1 puff into the lungs every 4 (four) hours as needed for wheezing or shortness of breath.  Dispense: 2 each; Refill: 11 -     EPINEPHrine ; Inject 0.3 mg into the muscle as needed for anaphylaxis.  Dispense: 1 each; Refill: 1 -     Fluticasone  Propionate; Place 1 spray into both nostrils daily.  Dispense: 10 mL; Refill: 0  History of allergic reaction -     Albuterol  Sulfate HFA; Inhale 1 puff into the lungs every 4 (four) hours as needed for wheezing or shortness of breath.  Dispense: 2 each; Refill: 11 -     EPINEPHrine ; Inject 0.3 mg into the muscle as needed for anaphylaxis.  Dispense: 1 each; Refill: 1 -     Fluticasone  Propionate; Place 1 spray into both nostrils daily.  Dispense: 10  mL; Refill: 0  Allergy to alpha-gal -     Albuterol  Sulfate HFA; Inhale 1 puff into the lungs every 4 (four) hours as needed for wheezing or shortness of breath.  Dispense: 2 each; Refill: 11 -     EPINEPHrine ; Inject 0.3 mg into the muscle as needed for anaphylaxis.  Dispense: 1 each; Refill: 1 -     Fluticasone  Propionate; Place 1 spray into both nostrils daily.  Dispense: 10 mL; Refill: 0  GAD (generalized anxiety disorder) -     LORazepam ; Take 1 tablet  (0.5 mg total) by mouth 2 (two) times daily as needed for anxiety.  Dispense: 30 tablet; Refill: 0 -     Escitalopram  Oxalate; Take 1 tablet (20 mg total) by mouth daily.  Dispense: 90 tablet; Refill: 4 -     CBC with Differential/Platelet -     Comprehensive metabolic panel with GFR  Reaction to severe stress -     LORazepam ; Take 1 tablet (0.5 mg total) by mouth 2 (two) times daily as needed for anxiety.  Dispense: 30 tablet; Refill: 0  Gastroesophageal reflux disease with esophagitis without hemorrhage -     AMB Referral VBCI Care Management -     Omeprazole ; Take 1 capsule (40 mg total) by mouth daily.  Dispense: 30 capsule; Refill: 3 -     Sucralfate; Take 1 tablet (1 g total) by mouth 4 (four) times daily -  with meals and at bedtime.  Dispense: 90 tablet; Refill: 3  Hyperlipidemia, unspecified hyperlipidemia type -     Rosuvastatin  Calcium ; Take 1 tablet (20 mg total) by mouth daily.  Dispense: 90 tablet; Refill: 3  Secondary hypertension -     Propranolol HCl ER; Take 1 capsule (60 mg total) by mouth daily.  Dispense: 90 capsule; Refill: 4  Hypertension, unspecified type -     AMB Referral VBCI Care Management -     Losartan  Potassium; Take 1 tablet (50 mg total) by mouth daily.  Dispense: 90 tablet; Refill: 3  Adjustment disorder with anxious mood          Most likely, in my medical opinion you should get insurance on the exchange subsidized and work with HUD for more affordable housing.    I'm sorry to hear about the challenges your family is facing with health insurance. Navigating this situation can be complex, but there are several strategies you can consider to manage healthcare costs until [facility] eligibility is established.  ?? Understanding Medicaid Eligibility Medicaid eligibility is determined by your Modified Adjusted Gross Income (MAGI) and household size. In [place] , the Medicaid expansion has not been implemented, so eligibility is limited.  However, if your family's income is at or below 100% of the Federal Poverty Level (FPL) and your wife has recently lost her job, you might qualify for Medicaid. It's advisable to apply for Medicaid even if you believe you don't qualify, as eligibility can vary based on specific circumstances.  ?? Managing Healthcare Costs Without Medicaid While awaiting Medicaid approval, here are some options to consider: 1. [employer] With a household income of approximately $40,000, your family may qualify for premium tax credits through the [employer]. These subsidies can significantly reduce monthly premiums, potentially lowering them to $10 or less per month. It's important to apply during the Special Enrollment Period (SEP) triggered by your wife's job loss. Coverage won't begin until the first premium is paid, so timely enrollment is crucial. (Investopedia) 2. Short-Term Health Plans If  immediate coverage is needed, consider short-term health insurance plans. These plans can provide temporary coverage, often with lower premiums. However, be aware that they may not cover pre-existing conditions and might not meet ACA standards. (RentRule.com.au) 3. Health Sharing Ministries Some families opt for health sharing ministries as an alternative to traditional insurance. These are faith-based organizations where members share healthcare costs. While they can be more affordable, they often don't cover all medical expenses and may not be recognized as minimum essential coverage under the ACA. [place] [facility])  ?? Practical Tips to Reduce Immediate Healthcare Costs Negotiate Medical Bills: Contact healthcare providers to negotiate bills or set up payment plans. Many providers offer discounts for uninsured patients or those paying out-of-pocket. [facility]: Look for [facility]) in your area. They offer services on a sliding fee scale based on  income. Prescription Assistance Programs: Explore programs that offer free or discounted medications for eligible individuals.  ? Transitioning to Medicaid Once Medicaid eligibility is established: Apply Promptly: Submit your application as soon as you believe you qualify. Gather Documentation: Prepare necessary documents such as proof of income, residency, and identification. Follow Up: Stay in contact with Medicaid representatives to ensure your application is processed efficiently.  If you need assistance estimating premium tax credits or exploring specific plan options in [place] , feel free to ask. I'm here to help navigate these options with you.        [facility] Emergency Housing Help Link to [employer] Guide Need a place to stay tonight? Or help with rent? This guide shows where to go. ?? If you need immediate help: Call [phone] (Coordinated Entry Hotline) ?? For any community service: Dial 2-1-1 anytime, day or night ?? EMERGENCY SHELTERS - Tonight's Stay [facility] Ministry - [employer] Who can stay: Single adults (men and women) Address: [address] [place]. Phone: [phone] What to bring: ID and Social Security card needed Check-in: Evening (call for time) FOR FAMILIES WITH CHILDREN: [facility] Address: [address] [place]. Phone: [phone] Note: Private units for families with children [facility] Address: [address] Phone: [phone] Note: For families with children [facility] Who can stay: Men, women, or families Address: [address] Phone: [phone] Hours to apply: Mon, Wed, Fri (by appointment) What to bring: Photo ID and proof of income (if any) SPECIAL SHELTERS: Room at the [facility] (for pregnant women) Phone: [phone] Note: For pregnant women and their children Domestic Violence Shelter ([facility]) 24/7  Crisis Line: [phone] For: People escaping domestic violence Location: Confidential (call first) ?? DAY Oceanographer - Services During the Day [facility]) Address: [address] Phone: [phone] Hours: Monday-Friday, 8:00 AM - 3:00 PM What they offer: Showers & Contractor & computers Help finding housing Safe parking program for those living in cars Note: No ID required to drop in ?? HELP FINDING HOUSING - Where to Start Coordinated Entry - First Call for Help ?? CALL: [phone] Hours: Monday-Friday, 8:30 AM - 5:00 PM What they do: This is where to start. They will ask questions about your situation and connect you to the right shelter or housing program. After hours? If it's night or weekend, go directly to a shelter or call 2-1-1. Housing Hotline ?? CALL: [phone] What they do: Help you find affordable housing or deal with landlord problems ?? HELP WITH  RENT OR UTILITIES [employer] - Emergency Assistance For Rent Help: [phone] ext. 340 For Utility Help: [phone] ext. 314 Address: [address] Rent help: First business day of each month, 8:30 AM (line forms early!) Utility help: Monday-Thursday, 8:30 AM - 11:00 AM What to bring: For rent: Eviction notice from court/sheriff For utilities: Shutoff notice Proof of income (pay stubs, benefits letter) ID and Social Security card [employer] - Financial Help Phone: [phone] Address: [address] Hours: Monday-Friday, 9:00 AM - 5:00 PM (call first) What to bring: Photo ID, proof of income, bills or notices Department of Social Services (DSS) Phone: [phone] Address: [address] Hours: Monday-Friday, 8:00 AM - 5:00 PM Note: Best for families with children ?? HELP WITH EVICTION Eviction Mediation Project Phone: [phone] What they do: Help you work things out with your landlord [place] Help (TEAM  Program) Where: [employer], [place]. When: Tuesdays at 9 AM & 1 PM; Wednesdays at 1 PM What they do: Help at your eviction hearing Legal Aid - Free Legal Help Phone: [phone] or [phone] Address: [address] What they do: Free lawyers for low-income people Hours to call: Monday-Friday, 8:30 AM - 4:30 PM ??? LONGER-TERM HOUSING [employer] (Section 8) Phone: [phone] Address: [address] What they offer: Housing vouchers to help pay rent Note: Long waiting list; call to check if applications are open [employer] These programs offer housing for up to 2 years with supportive services. Access is through Coordinated Entry: [facility]: For individuals and families [phone]) [facility]: For women in recovery with children [phone]) [facility]: For men, especially veterans [phone]) Important: Most transitional housing requires a referral from Coordinated Entry or a shelter. Call [phone] to get started. ?? REMEMBER Always bring your ID if you have one For fastest help, call Coordinated Entry: [phone] If it's after hours or weekend, go directly to a shelter For any community service: Dial 2-1-1 anytime This guide was prepared for patients of Primary Care. Updated April 2025.  Transportation Resources   Link to [employer]  Individualized Support: [address] [place] : VF Corporation connecting people with transportation resources. Dial 211 or visit https://barton-williams.info/.  Dial 2-1-1 or [phone]. They can help you find transportation options in your area based on your specific needs.  Public Transportation:  [employer] (GTA): Operates a fixed-route bus system with over 19 routes serving most areas of [place]. Offers real-time bus tracking and trip planning tools through their website and app. Provides  ADA-compliant buses with ramps and designated seating for individuals with disabilities. Sells day passes, weekly passes, and monthly passes to make riding more affordable. Offers reduced fares for seniors (65+) and individuals with disabilities who meet eligibility requirements.   Offers paratransit SCAT (Shared-Ride Cab Alternative): for eligible riders with disabilities who cannot use fixed-route buses due to their disability. Phone number: [phone] https://www.[place].gov/departments/transit  [employer] and [employer] curb-to-curb bus service. Eligibility: Open to all. FeesVary, depending on service and route. There is no fee for people age 4 and over. Visit website to download application or call to have one mailed. Call to check for eligibility. http://lewis.net/ Phone: [phone] (Toll-Free) or [phone] (Main)  PART ([place] [employer]):  Operates commuter bus routes connecting [place], [place], [place], and other areas in the [place] region.  Offers weekday service with limited Saturday service on some  routes.  StrategyVenture.se  Phone number: [phone]   Volunteer Transportation Services:  Mining engineer (VTG): Provides non-emergency medical transportation services for seniors and individuals with disabilities who have difficulty accessing traditional transportation means. Serves residents of [place], [place] . Offers rides to medical appointments, grocery shopping, and other essential errands. May require scheduling rides in advance due to volunteer availability. Contact VTG through their website.  https://volunteergso.RentalHair.uy   [employer] (GUM): Offers transportation assistance for some programs and services they provide, such as their  food pantry or medical clinic. Availability and eligibility might vary depending on the specific program. Contact GUM directly for details on transportation assistance for their programs.  https://www.greensborourbanministry.org/ Phone number: (310)459-6153  Ride-Sharing Assistance Programs:  [facility] Department of Social Services (DSS): May offer transportation vouchers or reimbursements for medical appointments in certain circumstances, particularly for Medicaid recipients. Eligibility depends on individual circumstances, medical needs, and program availability. Contact DSS for details on eligibility requirements and the application process. BlindWorkshop.com.pt [employer] or religious charities:  Some faith-based or local community organizations may offer limited ride-sharing assistance for essential needs like medical appointments or grocery shopping. Availability and eligibility criteria vary greatly. Research local organizations in your area to see if they offer transportation assistance programs.  Insurance-Covered Ride [employer] itself typically  does not directly cover non-emergency medical transportation (NEMT) for doctor appointments.  This means Original Medicare (Parts A & B) usually won't pay for rides to routine checkups or doctor visits.  However, there are some exceptions:  Ambulance transport in emergencies would likely be covered by Medicare Part B. Some Medicare Advantage Plans (Part C): These are private insurance plans that may cover non-emergency medical transportation as part of their benefits package. It's important to check with your specific Medicare Advantage plan to see if NEMT is covered and what the limitations might be.  Medicaid: Medicaid programs are administered by each state, so coverage can vary. However, Medicaid generally does cover  non-emergency medical transportation for eligible individuals to get to and from doctor's appointments for Medicaid-covered services. This means Medicaid may pay for rides to [facility], hospitals, or other healthcare providers for approved treatments. Here are some resources to learn more:  Medicare Transportation: https://www.ehealthinsurance.com/medicare/ Let Medicaid Give You a Ride: MobileSolver.com.au   Remember:  Always  check with your specific health insurance plan (Medicare Advantage, commercial, or Medicaid) to confirm  coverage details for non-emergency medical transportation.**  Food Resources   Link to [employer] on Locations: [address] [place] : VF Corporation connecting people with various assistance programs, including food support. Dial 2-1-1 or [phone] to speak with a representative for personalized guidance on finding food resources. https://barton-williams.info/ Feeding America: IT trainer with a network of food banks across the country. Their website allows you to search for food pantries and meal programs by zip code. https://www.ShopAutomobile.nl Second McKesson of [place] : CarDumps.nl  keeps an Environmental consultant of the food services regionally [facility] Department of Social Services (DSS): BlindWorkshop.com.pt RadarLocations.no Provides monthly benefits on an EBT card to help buy food.    Soup Kitchens and [employer] in [place]: Free Indeed [employer] [address] [phone]: 11a - 2p  [facility] - Care Link [address] [phone] M - F: times and dates vary - call agency   [facility] of [place]:  [address] [phone] a free community meal program along with other support services  [facility] [address] [phone] Provides hot meals, groceries, and clothing assistanc  Hot Dish and [facility] [address] [place] [phone] Www.fpcgreensboro.org for mealtimes  [employer] Potter's [employer] Pantries in [place] Second [employer] of [place] : CarDumps.nl  keeps an [employer] of the food services regionally [place] of [place] [address] [place] [phone] Sa: 10a - 12p  on varying weeks; call agency for schedule  [facility] [address] [place] [phone] M - F: 9:30a - 3:30p, eligibility requirements  [facility] of [facility] [address] [phone] Tu: 9a - 12p; Th 9a - 12p  Unice Sylvie Falconer of Praise [address] [phone] Tu : 10a - 12p; W: 10a - 12p; Th: 10a - 12p  [facility] [address] [phone] Tu: 9a - 11a; ThBETHA dinning - 11a  The [place] [facility] [address] [phone] 1st & 3rd Sa: 9a - 12p  [facility] [address] [place] [phone] by appointment Sa: 10:30a - 11:30a  [employer] [address] [place] [address] [place] [phone] mobile distribution sites; call agency for listing  [facility] [employer] [facility] [address] [place] [phone] mobile distribution sites; call agency for listing  [facility] [facility] [address] [phone] mobile distribution sites; call agency for listing  [facility] [address] [phone] W: 9a - 12p  [facility] [address] [phone] 1st & 3rd Tu: 10a - 1p; 3rd Sa: 10a - 12p  [facility] [address] [phone] M: 9:30a - 2:30p; Tu: 9:30a - 2:30p; W: 9:30a - 2:30p; Th: 9:30a - 2:30p  [facility] [address] [phone]  F: 11a - 2p  [facility] [employer] [phone] [address] [phone] 2nd & 4th Th: 1p - 2:30p  [facility] [address] [place] [phone] T: 12:p -2p  [employer] [address] [phone] 3rd Sat of month: 11a-1p  [employer] [address] [phone] 4th Sa: 9a - 11a  [facility] [address] [phone] [place] - F: 9a - 5p  Malvina Crews [facility] [address] [phone] 3rd Tu: 5p - 7p; 3rd Sa: 9a - 1p  [facility] [employer] [facility] [employer]ATRICE Fabian Solon [phone] 2nd M 5:30p - 7:30p; each W 9:30a - 11:30a  [facility] [place] [address] [phone] Tues 11a-1p call for appointment preferred  [facility] [address] [phone] mobile distribution sites; call agency for listing  [facility] [address] [phone] M & W 6:30p - 8:30p  Pop up on Th 11a - 12:30p at different locations (call)  [facility]-PD&[facility] [address] [phone] Thursdays 11a - 12p (please call for location)  [facility] [employer] [address] [phone] weekly on W 9a-10:30a  [facility] [address] [phone] 4th Saturday: 10a - 12p  [facility] [address] [place] [place] [phone] M-F 8am-5pm (by appt)  [facility] [address] [phone] M-F 8:30am-5:00pm (by appt)  [address] [place] [employer] [facility] [address]  Genetta Solon [phone] 2nd & 4th Saturday 11am-1pm  [facility] [address] [facility] [phone] Fridays 11am-2pm  [facility] [employer] [address] [place] [phone] Call for schedule  [facility] [address] [phone], 3rd Thursday 2:30-3:30  [facility] [address] [phone] Thursdays  10am-12:30pm  [facility] [address] [place] [phone] Sa: 9a-12p (2nd and 4th Saturday of each month)  [employer] [address] [phone] of [place] [address] [place]  Every third thursday  [facility] [address] [place] [phone]   [facility]  [address]  [phone].   Tuesdays from 5:30 PM to 7:30 PM and Saturdays from 10:00 AM to 12:00 PM.  [employer] ([facility]  [address] [phone] Food pantry is healthy food, they want to get appointments on healthy eating.   Mobile Food Pantries: [employer] (Out of the [employer]): Offers a Building services engineer with fresh produce, bread, meat, and non-perishable food. (Address: [address], [place], [place] - Phone number not available) [employer] [place], [place]): Wednesdays from 11:30 AM to 1:00 PM. Phone number: [phone] ([email or web address]) [employer] [facility] ([place], [place]): Located at [address], [place], [place] [address]. Phone number: [phone]. Family Market Administrator, arts - [place], [place]): Located at [address] [place], [place] [address]. Phone number: [phone]. Tuesdays from 5:30 PM to 7:30 PM and Saturdays from 10:00 AM to 12:00 PM. Runner, broadcasting/film/video of [employer]: While not located in [place], they offer a Building services engineer program that may serve some [place] residents. Contact for details on eligibility and service areas. - Phone number: [phone] ([email or web address]

## 2024-03-16 NOTE — Assessment & Plan Note (Signed)
 Hypertension is present with concerns about heart attack or stroke risk. Blood pressure management is critical given current stressors. Propranolol is prescribed to reduce blood pressure and anxiety, with additional benefits of reducing bleeding risk from stress ulcer and lowering adrenaline levels. He will continue losartan  for blood pressure management and has been provided a year's supply of medications to avoid frequent visits.

## 2024-03-16 NOTE — Assessment & Plan Note (Signed)
 Refill epi pen

## 2024-03-16 NOTE — Assessment & Plan Note (Signed)
 Gastroesophageal Reflux Disease with Esophagitis and Suspected Stress Ulcer   He reports pain below the sternum, suggestive of a stress ulcer. Omeprazole  is increased to 40 mg daily for GERD. Propranolol is prescribed to reduce blood flow and bleeding risk from the ulcer. Sucralfate is prescribed to coat and protect the stomach lining.

## 2024-03-16 NOTE — Assessment & Plan Note (Signed)
 Refill medications, reduce cardiovascular event risk from stress.

## 2024-03-16 NOTE — Assessment & Plan Note (Signed)
 Anxiety is exacerbated by financial and insurance stressors. Lexapro  is increased to 20 mg for better control. Ativan  is prescribed for acute anxiety episodes with caution about its addictive potential and potential impact on marriage dynamics.

## 2024-03-18 ENCOUNTER — Telehealth: Payer: Self-pay | Admitting: *Deleted

## 2024-03-18 NOTE — Progress Notes (Unsigned)
 Complex Care Management Note Care Guide Note  03/18/2024 Name: William Morrow MRN: [id number] DOB: [birth year]   Complex Care Management Outreach Attempts: An unsuccessful telephone outreach was attempted today to offer the patient information about available complex care management services.  Follow Up Plan:  Additional outreach attempts will be made to offer the patient complex care management information and services.   Encounter Outcome:  No Answer  Thedford Franks, CMA [place]  [facility], [facility] Guide Direct Dial: [phone]  Fax: [phone] Website: [email or web address]

## 2024-03-19 NOTE — Progress Notes (Unsigned)
 Complex Care Management Note Care Guide Note  03/19/2024 Name: HALLEY KINCER MRN: [id number] DOB: [birth year]   Complex Care Management Outreach Attempts: A second unsuccessful outreach was attempted today to offer the patient with information about available complex care management services.  Follow Up Plan:  Additional outreach attempts will be made to offer the patient complex care management information and services.   Encounter Outcome:  No Answer  Thedford Franks, CMA [place]  [facility] [place], [facility] [place] [facility] Guide Direct Dial: [phone]  Fax: [phone] Website: [email or web address]

## 2024-03-20 NOTE — Progress Notes (Signed)
 Complex Care Management Note Care Guide Note  03/20/2024 Name: William Morrow MRN: [id number] DOB: 11/19/1990   Complex Care Management Outreach Attempts: A third unsuccessful outreach was attempted today to offer the patient with information about available complex care management services.  Follow Up Plan:  No further outreach attempts will be made at this time. We have been unable to contact the patient to offer or enroll patient in complex care management services.  Encounter Outcome:  No Answer  Thedford Franks, CMA [place]  [facility], [facility] Guide Direct Dial: [phone]  Fax: [phone] Website: [email or web address]
